# Patient Record
Sex: Female | Born: 1975 | Race: White | Hispanic: No | Marital: Single | Smoking: Never smoker
Health system: Southern US, Community
[De-identification: ages and names within clinical notes are randomized; demographics above are authoritative.]

## PROBLEM LIST (undated history)

## (undated) DIAGNOSIS — M1990 Unspecified osteoarthritis, unspecified site: Secondary | ICD-10-CM

## (undated) DIAGNOSIS — J45909 Unspecified asthma, uncomplicated: Secondary | ICD-10-CM

## (undated) HISTORY — DX: Unspecified osteoarthritis, unspecified site: M19.90

## (undated) HISTORY — DX: Unspecified asthma, uncomplicated: J45.909

---

## 2007-06-06 ENCOUNTER — Other Ambulatory Visit: Admission: RE | Admit: 2007-06-06 | Discharge: 2007-06-06 | Payer: Self-pay | Admitting: Family Medicine

## 2008-10-31 ENCOUNTER — Encounter: Admission: RE | Admit: 2008-10-31 | Discharge: 2008-10-31 | Payer: Self-pay | Admitting: Otolaryngology

## 2009-11-04 HISTORY — PX: KNEE SURGERY: SHX244

## 2010-05-17 ENCOUNTER — Other Ambulatory Visit
Admission: RE | Admit: 2010-05-17 | Discharge: 2010-05-17 | Payer: Self-pay | Source: Home / Self Care | Admitting: Family Medicine

## 2011-09-09 ENCOUNTER — Ambulatory Visit (INDEPENDENT_AMBULATORY_CARE_PROVIDER_SITE_OTHER): Payer: BC Managed Care – PPO | Admitting: Pulmonary Disease

## 2011-09-09 ENCOUNTER — Other Ambulatory Visit (INDEPENDENT_AMBULATORY_CARE_PROVIDER_SITE_OTHER): Payer: BC Managed Care – PPO

## 2011-09-09 ENCOUNTER — Encounter: Payer: Self-pay | Admitting: Pulmonary Disease

## 2011-09-09 VITALS — BP 118/70 | HR 67 | Temp 98.1°F | Ht 69.0 in | Wt 172.2 lb

## 2011-09-09 DIAGNOSIS — R091 Pleurisy: Secondary | ICD-10-CM

## 2011-09-09 LAB — SEDIMENTATION RATE: Sed Rate: 7 mm/hr (ref 0–22)

## 2011-09-09 MED ORDER — PREDNISONE 10 MG PO TABS: ORAL_TABLET | ORAL | Status: DC

## 2011-09-09 MED ORDER — IBUPROFEN 600 MG PO TABS: 600.0000 mg | ORAL_TABLET | Freq: Three times a day (TID) | ORAL | Status: AC

## 2011-09-09 NOTE — Patient Instructions (Signed)
Will check bloodwork for autoimmune disease, and call you with results. Will treat with a course of prednisone, followed by anti-inflammatories to keep pain controlled.  Would start ibuprofen 600mg  roughly every 8 hrs everyday no matter what for one week once prednisone is complete, then can decrease to as needed. Please call me in a few weeks with how things are going.

## 2011-09-09 NOTE — Progress Notes (Signed)
  Subjective:    Patient ID: Brooke Navarro, female    DOB: May 09, 1976, 36 y.o.   MRN: [id number]  HPI The patient is a 36 year old female who I've been asked to see for persistent pleuritic chest pain.  The patient states that she was in her usual state of health until approximately 3 weeks ago, when she began to notice a sharp pain under her left breast with radiation laterally.  She did not have any previous viral prodrome.  She took nonsteroidal anti-inflammatory medications, but continued to have significant pain.  She was seen by her primary care physician where she had a normal chest x-ray, and a d-dimer was normal as well.  She did not have any prior history of long travel time, had no lower extremity edema or discomfort, is not pregnant, and had no history in her family of thromboembolic disease.  The patient was subsequently treated with prednisone for approximately 9 days, and had a significant improvement in her symptoms.  She was subsequently weaned off prednisone, and has been off the medication since 2 days ago.  She noticed a recurrence of her chest discomfort as the prednisone was tapered, and currently is at 3-4/10.  Was 8/10 prior to being on prednisone.  What is interesting is the patient has a history of knee arthritis that is unexplained, and most recently has had some wrist discomfort that hasn't resolved.  She denies any history of spine disease.   Review of Systems  Constitutional: Negative for fever and unexpected weight change.  HENT: Negative for ear pain, nosebleeds, congestion, sore throat, rhinorrhea, sneezing, trouble swallowing, dental problem, postnasal drip and sinus pressure.   Eyes: Negative for redness and itching.  Respiratory: Positive for shortness of breath. Negative for cough, chest tightness and wheezing.   Cardiovascular: Positive for chest pain. Negative for palpitations and leg swelling.  Gastrointestinal: Negative for nausea and vomiting.  Genitourinary:  Negative for dysuria.  Skin: Negative for rash.  Neurological: Negative for headaches.  Hematological: Does not bruise/bleed easily.  Psychiatric/Behavioral: Negative for dysphoric mood. The patient is not nervous/anxious.        Objective:   Physical Exam Constitutional:  Well developed, no acute distress  HENT:  Nares patent without discharge, but narrowed bilat  Oropharynx without exudate, palate and uvula are normal  Eyes:  Perrla, eomi, no scleral icterus  Neck:  No JVD, no TMG  Cardiovascular:  Normal rate, regular rhythm, no rubs or gallops.  No murmurs        Intact distal pulses.  No pericardial rub  Pulmonary :  Normal breath sounds, no stridor or respiratory distress   No rales, rhonchi, or wheezing.  No pleural rub lateral on left  Abdominal:  Soft, nondistended, bowel sounds present.  No tenderness noted.   Musculoskeletal:  No lower extremity edema noted, no calf tenderness  Lymph Nodes:  No cervical lymphadenopathy noted  Skin:  No cyanosis noted  Neurologic:  Alert, appropriate, moves all 4 extremities without obvious deficit.         Assessment & Plan:

## 2011-09-09 NOTE — Assessment & Plan Note (Signed)
The patient is describing classic pleuritic chest pain that started spontaneously, with no bilateral prodrome.  It was very responsive to prednisone, but recurred as the prednisone was being tapered.  She really has no history to suggest thromboembolic disease, and her d-dimer was normal.  Her chest x-ray last month was clear.  It may be this is all post viral in origin, and she just needs a longer course of anti-inflammatory therapy to improve.  However, because of the persistent nature, would also consider whether she may have some type of autoimmune process driving all of this.  She does have significant arthritis in her knee at a very early age without prior injuries, and has other musculoskeletal complaints.  Will check some screening blood work for autoimmune disease, and will also treat her again with a course of prednisone followed by consistent use of nonsteroidal anti-inflammatories.

## 2011-09-10 LAB — RHEUMATOID FACTOR: Rhuematoid fact SerPl-aCnc: <10 IU/mL (ref <=14)

## 2011-09-12 LAB — ANA: Anti Nuclear Antibody(ANA): NEGATIVE

## 2011-09-13 ENCOUNTER — Telehealth: Payer: Self-pay

## 2011-09-13 NOTE — Telephone Encounter (Signed)
Notes Recorded by Barbaraann Share, MD on 09/12/2011 at 5:51 PM All of her autoimmune workup has come back normal. Good news. Lets see how she responds to what we have outlined.   Pt advised of results. Carron Curie, CMA

## 2012-12-12 ENCOUNTER — Other Ambulatory Visit (HOSPITAL_COMMUNITY): Payer: Self-pay | Admitting: Orthopedic Surgery

## 2012-12-12 DIAGNOSIS — M479 Spondylosis, unspecified: Secondary | ICD-10-CM

## 2012-12-20 ENCOUNTER — Ambulatory Visit (HOSPITAL_COMMUNITY)
Admission: RE | Admit: 2012-12-20 | Discharge: 2012-12-20 | Disposition: A | Discharge status: Home / Self Care | Payer: BC Managed Care – PPO | Source: Ambulatory Visit | Attending: Orthopedic Surgery | Admitting: Orthopedic Surgery

## 2012-12-20 ENCOUNTER — Inpatient Hospital Stay
Admission: RE | Admit: 2012-12-20 | Discharge: 2012-12-20 | Disposition: A | Discharge status: Home / Self Care | Payer: Self-pay | Source: Ambulatory Visit | Attending: Orthopedic Surgery | Admitting: Orthopedic Surgery

## 2012-12-20 ENCOUNTER — Other Ambulatory Visit: Payer: Self-pay | Admitting: Orthopedic Surgery

## 2012-12-20 DIAGNOSIS — R200 Anesthesia of skin: Secondary | ICD-10-CM

## 2012-12-20 DIAGNOSIS — M542 Cervicalgia: Secondary | ICD-10-CM | POA: Insufficient documentation

## 2012-12-20 DIAGNOSIS — M79609 Pain in unspecified limb: Secondary | ICD-10-CM | POA: Insufficient documentation

## 2012-12-20 DIAGNOSIS — M439 Deforming dorsopathy, unspecified: Secondary | ICD-10-CM | POA: Insufficient documentation

## 2012-12-20 DIAGNOSIS — R209 Unspecified disturbances of skin sensation: Secondary | ICD-10-CM | POA: Insufficient documentation

## 2012-12-20 DIAGNOSIS — M949 Disorder of cartilage, unspecified: Secondary | ICD-10-CM | POA: Insufficient documentation

## 2012-12-20 DIAGNOSIS — M25519 Pain in unspecified shoulder: Secondary | ICD-10-CM | POA: Insufficient documentation

## 2012-12-20 DIAGNOSIS — M899 Disorder of bone, unspecified: Secondary | ICD-10-CM | POA: Insufficient documentation

## 2012-12-20 DIAGNOSIS — M479 Spondylosis, unspecified: Secondary | ICD-10-CM

## 2013-08-05 ENCOUNTER — Other Ambulatory Visit: Payer: Self-pay | Admitting: Obstetrics and Gynecology

## 2013-08-05 ENCOUNTER — Other Ambulatory Visit (HOSPITAL_COMMUNITY)
Admission: RE | Admit: 2013-08-05 | Discharge: 2013-08-05 | Disposition: A | Discharge status: Home / Self Care | Payer: BC Managed Care – PPO | Source: Ambulatory Visit | Attending: Obstetrics and Gynecology | Admitting: Obstetrics and Gynecology

## 2013-08-05 DIAGNOSIS — Z1151 Encounter for screening for human papillomavirus (HPV): Secondary | ICD-10-CM | POA: Insufficient documentation

## 2013-08-05 DIAGNOSIS — Z01419 Encounter for gynecological examination (general) (routine) without abnormal findings: Secondary | ICD-10-CM | POA: Insufficient documentation

## 2016-07-13 DIAGNOSIS — G5601 Carpal tunnel syndrome, right upper limb: Secondary | ICD-10-CM | POA: Insufficient documentation

## 2016-07-13 DIAGNOSIS — M1811 Unilateral primary osteoarthritis of first carpometacarpal joint, right hand: Secondary | ICD-10-CM | POA: Insufficient documentation

## 2016-08-15 ENCOUNTER — Other Ambulatory Visit: Payer: Self-pay | Admitting: Obstetrics and Gynecology

## 2016-08-15 ENCOUNTER — Other Ambulatory Visit (HOSPITAL_COMMUNITY)
Admission: RE | Admit: 2016-08-15 | Discharge: 2016-08-15 | Disposition: A | Discharge status: Home / Self Care | Payer: PPO | Source: Ambulatory Visit | Attending: Obstetrics and Gynecology | Admitting: Obstetrics and Gynecology

## 2016-08-15 DIAGNOSIS — Z1151 Encounter for screening for human papillomavirus (HPV): Secondary | ICD-10-CM | POA: Diagnosis present

## 2016-08-15 DIAGNOSIS — Z01419 Encounter for gynecological examination (general) (routine) without abnormal findings: Secondary | ICD-10-CM | POA: Insufficient documentation

## 2016-08-17 LAB — CYTOLOGY - PAP
Adequacy: ABSENT
Diagnosis: NEGATIVE
HPV: NOT DETECTED

## 2017-11-03 ENCOUNTER — Ambulatory Visit
Admission: RE | Admit: 2017-11-03 | Discharge: 2017-11-03 | Disposition: A | Discharge status: Home / Self Care | Payer: PPO | Source: Ambulatory Visit | Attending: Obstetrics and Gynecology | Admitting: Obstetrics and Gynecology

## 2017-11-03 ENCOUNTER — Other Ambulatory Visit: Payer: Self-pay | Admitting: Obstetrics and Gynecology

## 2017-11-03 DIAGNOSIS — Z1231 Encounter for screening mammogram for malignant neoplasm of breast: Secondary | ICD-10-CM

## 2018-11-09 ENCOUNTER — Other Ambulatory Visit: Payer: Self-pay | Admitting: Obstetrics and Gynecology

## 2018-11-09 DIAGNOSIS — Z1231 Encounter for screening mammogram for malignant neoplasm of breast: Secondary | ICD-10-CM

## 2019-03-18 ENCOUNTER — Ambulatory Visit
Admission: RE | Admit: 2019-03-18 | Discharge: 2019-03-18 | Disposition: A | Discharge status: Home / Self Care | Payer: BC Managed Care – PPO | Source: Ambulatory Visit | Attending: Obstetrics and Gynecology | Admitting: Obstetrics and Gynecology

## 2019-03-18 ENCOUNTER — Other Ambulatory Visit: Payer: Self-pay

## 2019-03-18 DIAGNOSIS — Z1231 Encounter for screening mammogram for malignant neoplasm of breast: Secondary | ICD-10-CM

## 2019-03-20 ENCOUNTER — Other Ambulatory Visit: Payer: Self-pay | Admitting: Obstetrics and Gynecology

## 2019-03-20 DIAGNOSIS — R928 Other abnormal and inconclusive findings on diagnostic imaging of breast: Secondary | ICD-10-CM

## 2019-03-21 ENCOUNTER — Ambulatory Visit
Admission: RE | Admit: 2019-03-21 | Discharge: 2019-03-21 | Disposition: A | Discharge status: Home / Self Care | Payer: PPO | Source: Ambulatory Visit | Attending: Obstetrics and Gynecology | Admitting: Obstetrics and Gynecology

## 2019-03-21 ENCOUNTER — Ambulatory Visit
Admission: RE | Admit: 2019-03-21 | Discharge: 2019-03-21 | Disposition: A | Discharge status: Home / Self Care | Payer: BC Managed Care – PPO | Source: Ambulatory Visit | Attending: Obstetrics and Gynecology | Admitting: Obstetrics and Gynecology

## 2019-03-21 ENCOUNTER — Other Ambulatory Visit: Payer: Self-pay

## 2019-03-21 DIAGNOSIS — R928 Other abnormal and inconclusive findings on diagnostic imaging of breast: Secondary | ICD-10-CM

## 2019-12-16 ENCOUNTER — Other Ambulatory Visit: Payer: Self-pay | Admitting: Obstetrics and Gynecology

## 2019-12-16 DIAGNOSIS — Z1231 Encounter for screening mammogram for malignant neoplasm of breast: Secondary | ICD-10-CM

## 2020-03-23 ENCOUNTER — Ambulatory Visit: Payer: BC Managed Care – PPO

## 2020-04-23 ENCOUNTER — Ambulatory Visit: Payer: BC Managed Care – PPO

## 2020-04-23 DIAGNOSIS — M79642 Pain in left hand: Secondary | ICD-10-CM | POA: Insufficient documentation

## 2020-06-09 ENCOUNTER — Ambulatory Visit: Payer: BC Managed Care – PPO

## 2020-06-26 DIAGNOSIS — M79644 Pain in right finger(s): Secondary | ICD-10-CM | POA: Insufficient documentation

## 2020-07-07 ENCOUNTER — Other Ambulatory Visit: Payer: Self-pay

## 2020-07-07 ENCOUNTER — Ambulatory Visit
Admission: RE | Admit: 2020-07-07 | Discharge: 2020-07-07 | Disposition: A | Discharge status: Home / Self Care | Payer: BC Managed Care – PPO | Source: Ambulatory Visit | Attending: Obstetrics and Gynecology | Admitting: Obstetrics and Gynecology

## 2020-07-07 DIAGNOSIS — Z1231 Encounter for screening mammogram for malignant neoplasm of breast: Secondary | ICD-10-CM

## 2020-12-23 NOTE — Progress Notes (Signed)
New Patient Note  RE: Brooke Navarro MRN: [id number] DOB: 1976/02/05 Date of Office Visit: 12/24/2020  Consult requested by: No ref. provider found Primary care provider: Tally Joe, MD  Chief Complaint: Allergic Rhinitis  and Cough  History of Present Illness: I had the pleasure of seeing Brooke Navarro for initial evaluation at the [facility] on 12/24/2020. She is a 45 y.o. female, who is referred here by Tally Joe, MD for the evaluation of allergic rhinitis and cough.  She reports symptoms of sneezing, dry coughing, nasal congestion, rhinorrhea, PND, itchy/watery eyes. Symptoms have been going on for many years and worsening the last few years The symptoms are present all year around with worsening in fall/winter. Other triggers include exposure to cats, dogs. Anosmia: no. Headache: not related to allergy. She has used Careers adviser, Xyzal, benadryl, Flonase with some improvement in symptoms. Sinus infections: no. Previous work up includes: none. Previous ENT evaluation: yes for ear issues. Previous sinus imaging: no. History of nasal polyps: no. Last eye exam: 1 year ago. History of reflux: taking famotidine 20mg  twice a day.  She reports symptoms of shortness of breath, coughing with post tussive emesis, not sure about wheezing, nocturnal awakenings for few years. Current medications include albuterol nebulizer. She reports not using aerochamber with inhalers. She tried the following inhalers: albuterol nebulizer. Main triggers are allergies, strong scents, smoking. In the last month, frequency of symptoms: daily when it flares. Frequency of nocturnal symptoms: nightly when it flares. Frequency of SABA use: <1x/week. Interference with physical activity: no. Sleep is disturbed. In the last 12 months, emergency room visits/urgent care visits/doctor office visits or hospitalizations due to respiratory issues: no. In the last 12 months, oral steroids courses: no. Lifetime  history of hospitalization for respiratory issues: no. Prior intubations: no. History of pneumonia: yes in the past. She was evaluated by pulmonologist in the past. Smoking exposure: denies. Up to date with flu vaccine: yes. Up to date with COVID-19 vaccine: yes. Prior Covid-19 infection: no. 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  01/28/2020 CXR: "IMPRESSION:  No evidence of acute cardiopulmonary disease."  Assessment and Plan: Brooke Navarro is a 45 y.o. female with: Other allergic rhinitis Perennial rhinoconjunctivitis symptoms with worsening the fall and winter - coughing is most bothersome.  Tried Allegra, Xyzal, Benadryl and Flonase with some benefit.  No prior work-up. Today's skin testing showed: Positive to grass, ragweed, weed, trees, dust mites, cat, dog, mouse. Borderline to mold. Start environmental control measures as below. Use over the counter antihistamines such as Zyrtec (cetirizine), Claritin (loratadine), Allegra (fexofenadine), or Xyzal (levocetirizine) daily as needed. May take twice a day during allergy flares. May switch antihistamines every few months. Start Singulair (montelukast) 10mg  daily at night. Cautioned that in some children/adults can experience behavioral changes including hyperactivity, agitation, depression, sleep disturbances and suicidal ideations. These side effects are rare, but if you notice them you should notify me and discontinue Singulair (montelukast). Start dymista (fluticasone + azelastine nasal spray combination) 1 spray per nostril twice a day. This replaces Flonase (fluticasone) for now. If it's not covered let 08-22-1985 know.  Nasal saline spray (i.e., Simply Saline) or nasal saline lavage (i.e., NeilMed) is recommended as needed and prior to medicated nasal sprays. Declines eye drops. Consider allergy injections for long term control if above medications do not help the symptoms - handout given.   Allergic conjunctivitis of both  eyes See assessment and plan as above.  Chronic coughing Patient noted  coughing with posttussive emesis at times for the last few years.  Using albuterol nebulizer less than once a week with good benefit.  History of pleurisy.  No prior COVID-19 infection.  Finished treatment for latent TB.  Normal chest x-ray in 2021.  Takes famotidine for reflux. Today's skin testing showed: Positive to grass, ragweed, weed, trees, dust mites, cat, dog, mouse. Borderline to mold. Positive to peanuts and soy and borderline positive to sesame. Today's spirometry was normal with 4% improvement in FEV1 post bronchodilator treatment.  Clinically feeling improved. The most common causes of chronic cough include the following: upper airway cough syndrome (UACS) which is caused by variety of rhinitis conditions; asthma; gastroesophageal reflux disease (GERD); chronic bronchitis from cigarette smoking or other inhaled environmental irritants; non-asthmatic eosinophilic bronchitis; and bronchiectasis.  In prospective studies, these conditions have accounted for up to 94% of the causes of chronic cough in immunocompetent adults.  Given patient's clinical history and testing results concerning if environmental allergies are contributing to her coughing.  Patient eats peanuts frequently and did not notice any immediate symptoms. Rarely has sesame or soy.  The positive skin testing may be due to oral allergy syndrome as she had very large sensitivity to birch pollen which cross reacts with peanuts and soy. Monitor symptoms after eating soy and peanuts. May want to decrease consumption and see if it improves symptoms. Okay to eat sesame as before. For mild symptoms you can take over the counter antihistamines such as Benadryl and monitor symptoms closely. If symptoms worsen or if you have severe symptoms including breathing issues, throat closure, significant swelling, whole body hives, severe diarrhea and vomiting, lightheadedness  then seek immediate medical care. May use albuterol rescue inhaler 2 puffs or nebulizer every 4 to 6 hours as needed for shortness of breath, chest tightness, coughing, and wheezing. May use albuterol rescue inhaler 2 puffs 5 to 15 minutes prior to strenuous physical activities. Monitor frequency of use.   Heartburn Stable with famotidine 20mg  BID. See below for lifestyle and dietary modifications. Continue famotine 20mg  twice a day. If coughing is not improved after 1 month then start omeprazole 40mg  once a day in the morning. Nothing to eat or drink for 30 minutes afterwards.   Return in about 2 months (around 02/24/2021).  Meds ordered this encounter  Medications   Azelastine-Fluticasone 137-50 MCG/ACT SUSP    Sig: Place 1 spray into the nose in the morning and at bedtime.    Dispense:  23 g    Refill:  5    Failed flonase   montelukast (SINGULAIR) 10 MG tablet    Sig: Take 1 tablet (10 mg total) by mouth at bedtime.    Dispense:  30 tablet    Refill:  5   albuterol (VENTOLIN HFA) 108 (90 Base) MCG/ACT inhaler    Sig: Inhale 2 puffs into the lungs every 4 (four) hours as needed for wheezing or shortness of breath (coughing fits).    Dispense:  18 g    Refill:  1   omeprazole (PRILOSEC) 40 MG capsule    Sig: Take 1 capsule (40 mg total) by mouth daily. Nothing to eat or drink for 30 minutes afterwards.    Dispense:  30 capsule    Refill:  3    Hold Rx until ready for pick up.    Lab Orders  No laboratory test(s) ordered today    Other allergy screening: Food allergy: no Medication allergy: no Hymenoptera allergy: no Urticaria: no  Eczema:no History of recurrent infections suggestive of immunodeficency: no  Diagnostics: Spirometry:  Tracings reviewed. Her effort: Good reproducible efforts. FVC: 3.71L FEV1: 3.06L, 89% predicted FEV1/FVC ratio: 82% Interpretation: Spirometry consistent with normal pattern with 4% improvement in FEV1 post bronchodilator treatment.  Clinically feeling improved.  Please see scanned spirometry results for details.  Skin Testing: Environmental allergy panel and select foods. Positive to grass, ragweed, weed, trees, dust mites, cat, dog, mouse. Borderline to mold. Positive to peanuts and soy and borderline positive to sesame. Results discussed with patient/family.  Airborne Adult Perc - 12/24/20 0930     Time Antigen Placed 0930    Allergen Manufacturer Waynette Buttery    Location Back    Number of Test 59    Panel 1 Select    1. Control-Buffer 50% Glycerol Negative    2. Control-Histamine 1 mg/ml 2+    3. Albumin saline Negative    4. Bahia 4+    5. [place] 4+    6. Johnson 4+    7. Kentucky Blue 4+    8. Meadow Fescue 4+    9. Perennial Rye 4+    10. Sweet Vernal 4+    11. Timothy 4+    12. Cocklebur Negative    13. Burweed Marshelder Negative    14. Ragweed, short 4+    15. Ragweed, Giant 2+    16. Plantain,  English 2+    17. Lamb's Quarters Negative    18. Sheep Sorrell Negative    19. Rough Pigweed Negative    20. Marsh Elder, Rough Negative    21. Mugwort, Common Negative    22. Ash mix 2+    23. Birch mix 4+    24. Beech American 4+    25. Box, Elder 3+    26. Cedar, red Negative    27. Cottonwood, Guinea-Bissau Negative    28. Elm mix Negative    29. Hickory Negative    30. Maple mix 2+    31. Oak, Guinea[place] mix 3+    32. Pecan Pollen Negative    33. Pine mix Negative    34. Sycamore Eastern Negative    35. Walnut, Black Pollen Negative    36. Alternaria alternata Negative    37. Cladosporium Herbarum Negative    38. Aspergillus mix Negative    39. Penicillium mix Negative    40. Bipolaris sorokiniana (Helminthosporium) Negative    41. Drechslera spicifera (Curvularia) Negative    42. Mucor plumbeus Negative    43. Fusarium moniliforme Negative    44. Aureobasidium pullulans (pullulara) Negative    45. Rhizopus oryzae Negative    46. Botrytis cinera Negative    47. Epicoccum nigrum Negative    48.  Phoma betae Negative    49. Candida Albicans Negative    50. Trichophyton mentagrophytes Negative    51. Mite, D Farinae  5,000 AU/ml 2+    52. Mite, D Pteronyssinus  5,000 AU/ml 2+    53. Cat Hair 10,000 BAU/ml 3+    54.  Dog Epithelia 4+    55. Mixed Feathers Negative    56. Horse Epithelia Negative    57. Cockroach, German Negative    58. Mouse 3+    59. Tobacco Leaf Negative             Food Perc - 12/24/20 0931       Test Information   Time Antigen Placed 7902    Allergen Manufacturer Waynette Buttery    Location Back  Number of allergen test 10    Food Select      Food   1. Peanut --   10x6   2. Soybean food --   9x5   3. Wheat, whole Negative    4. Sesame --   2x2   5. Milk, cow Negative    6. Egg White, chicken Negative    7. Casein Negative    8. Shellfish mix Negative    9. Fish mix Negative    10. Cashew Negative             Intradermal - 12/24/20 1021     Time Antigen Placed 1021    Allergen Manufacturer Greer    Location Arm    Number of Test 6    Intradermal Select    Control Negative    Mold 1 Negative    Mold 2 Negative    Mold 3 Negative    Mold 4 --   +/-   Cockroach Negative             Past Medical History: Patient Active Problem List   Diagnosis Date Noted   Other allergic rhinitis 12/24/2020   Allergic conjunctivitis of both eyes 12/24/2020   Heartburn 12/24/2020   Chronic coughing 12/24/2020   Pleurisy, residual 09/09/2011   Past Medical History:  Diagnosis Date   Allergic rhinitis    Arthritis    Past Surgical History: Past Surgical History:  Procedure Laterality Date   KNEE SURGERY  11/2009   left   Medication List:  Current Outpatient Medications  Medication Sig Dispense Refill   albuterol (VENTOLIN HFA) 108 (90 Base) MCG/ACT inhaler Inhale 2 puffs into the lungs every 4 (four) hours as needed for wheezing or shortness of breath (coughing fits). 18 g 1   Azelastine-Fluticasone 137-50 MCG/ACT SUSP Place 1 spray  into the nose in the morning and at bedtime. 23 g 5   clindamycin (CLINDAGEL) 1 % gel Apply 1 application topically 2 (two) times daily.     clobetasol ointment (TEMOVATE) 0.05 % Apply topically 2 (two) times daily.     cyclobenzaprine (FLEXERIL) 10 MG tablet 1 TABLET THREE TIMES A DAY AS NEEDED ORALLY 30 DAY(S)     diphenhydrAMINE (BENADRYL) 25 MG tablet      escitalopram (LEXAPRO) 10 MG tablet Take 10 mg by mouth daily.     famotidine (PEPCID) 20 MG tablet      JUNEL FE 1/20 1-20 MG-MCG tablet Take by mouth.     levocetirizine (XYZAL) 5 MG tablet      meloxicam (MOBIC) 15 MG tablet Take 15 mg by mouth daily.     Misc Natural Products (COSAMIN ASU FOR JOINT HEALTH) CAPS      montelukast (SINGULAIR) 10 MG tablet Take 1 tablet (10 mg total) by mouth at bedtime. 30 tablet 5   Multiple Vitamin (MULTI-VITAMIN) tablet Take 1 tablet by mouth daily.     omeprazole (PRILOSEC) 40 MG capsule Take 1 capsule (40 mg total) by mouth daily. Nothing to eat or drink for 30 minutes afterwards. 30 capsule 3   azelastine (ASTELIN) 0.1 % nasal spray Place 1-2 sprays into both nostrils 2 (two) times daily as needed (nasal drainage). Use in each nostril as directed 30 mL 5   fluticasone (FLONASE) 50 MCG/ACT nasal spray Place 1 spray into both nostrils 2 (two) times daily as needed (nasal congestion). 16 g 5   No current facility-administered medications for this visit.   Allergies: No Known  Allergies Social History: Social History   Socioeconomic History   Marital status: Single    Spouse name: Not on file   Number of children: 0   Years of education: Not on file   Highest education level: Not on file  Occupational History   Occupation: Theatre manager II    Employer: [employer]    Comment: [employer]  Tobacco Use   Smoking status: Never    Passive exposure: Current   Smokeless tobacco: Never  Vaping Use   Vaping Use: Never used  Substance and Sexual Activity   Alcohol  use: Yes   Drug use: Never   Sexual activity: Not on file  Other Topics Concern   Not on file  Social History Narrative   Pt is single and lives with family.    Social Determinants of Health   Financial Resource Strain: Not on file  Food Insecurity: Not on file  Transportation Needs: Not on file  Physical Activity: Not on file  Stress: Not on file  Social Connections: Not on file   Lives in a house which is 43 years old. Smoking: denies Occupation: PA  Environmental HistorySurveyor, minerals in the house: no Engineer, civil (consulting) in the family room: no Carpet in the bedroom: no Heating: electric Cooling: central Pet: yes 2 dogs sometimes come visit  Family History: Family History  Problem Relation Age of Onset   Allergic rhinitis Mother    Allergic rhinitis Father    Urticaria Father    Allergic rhinitis Sister    Urticaria Sister    Asthma Sister    Allergic rhinitis Brother    Breast cancer Maternal Grandmother    Melanoma Maternal Grandfather    Heart disease Paternal Grandmother    Lung cancer Paternal Grandfather    Review of Systems  Constitutional:  Negative for appetite change, chills, fever and unexpected weight change.  HENT:  Positive for congestion, postnasal drip, rhinorrhea and sneezing.   Eyes:  Negative for itching.  Respiratory:  Positive for cough. Negative for chest tightness, shortness of breath and wheezing.   Cardiovascular:  Negative for chest pain.  Gastrointestinal:  Negative for abdominal pain.  Genitourinary:  Negative for difficulty urinating.  Skin:  Negative for rash.  Allergic/Immunologic: Positive for environmental allergies.  Neurological:  Negative for headaches.   Objective: BP 130/78   Pulse 95   Temp (!) 97 F (36.1 C) (Temporal)   Resp 17   Ht 5' 8.5" (1.74 m)   Wt 220 lb 12 oz (100.1 kg)   SpO2 97%   BMI 33.08 kg/m  Body mass index is 33.08 kg/m. Physical Exam Vitals and nursing note reviewed.  Constitutional:       Appearance: Normal appearance. She is well-developed.  HENT:     Head: Normocephalic and atraumatic.     Right Ear: External ear normal.     Left Ear: External ear normal.     Nose: Nose normal.     Mouth/Throat:     Mouth: Mucous membranes are moist.     Pharynx: Oropharynx is clear.  Eyes:     Conjunctiva/sclera: Conjunctivae normal.  Cardiovascular:     Rate and Rhythm: Normal rate and regular rhythm.     Heart sounds: Normal heart sounds. No murmur heard.   No friction rub. No gallop.  Pulmonary:     Effort: Pulmonary effort is normal.     Breath sounds: Normal breath sounds. No wheezing, rhonchi or rales.  Abdominal:     Palpations: Abdomen is soft.  Musculoskeletal:     Cervical back: Neck supple.  Skin:    General: Skin is warm.     Findings: No rash.  Neurological:     Mental Status: She is alert and oriented to person, place, and time.  Psychiatric:        Behavior: Behavior normal.  The plan was reviewed with the patient/family, and all questions/concerned were addressed.  It was my pleasure to see Maha today and participate in her care. Please feel free to contact me with any questions or concerns.  Sincerely,  Wyline Mood, DO Allergy & Immunology  [facility] office: [phone] [facility] office: [phone]

## 2020-12-24 ENCOUNTER — Other Ambulatory Visit: Payer: Self-pay

## 2020-12-24 ENCOUNTER — Telehealth: Payer: Self-pay

## 2020-12-24 ENCOUNTER — Encounter: Payer: Self-pay | Admitting: Allergy

## 2020-12-24 ENCOUNTER — Ambulatory Visit: Payer: 59 | Admitting: Allergy

## 2020-12-24 VITALS — BP 130/78 | HR 95 | Temp 97.0°F | Resp 17 | Ht 68.5 in | Wt 220.8 lb

## 2020-12-24 DIAGNOSIS — R053 Chronic cough: Secondary | ICD-10-CM | POA: Diagnosis not present

## 2020-12-24 DIAGNOSIS — R12 Heartburn: Secondary | ICD-10-CM | POA: Diagnosis not present

## 2020-12-24 DIAGNOSIS — J3089 Other allergic rhinitis: Secondary | ICD-10-CM

## 2020-12-24 DIAGNOSIS — H1013 Acute atopic conjunctivitis, bilateral: Secondary | ICD-10-CM | POA: Diagnosis not present

## 2020-12-24 MED ORDER — AZELASTINE-FLUTICASONE 137-50 MCG/ACT NA SUSP: 1.0000 | Freq: Two times a day (BID) | NASAL | 5 refills | Status: DC

## 2020-12-24 MED ORDER — OMEPRAZOLE 40 MG PO CPDR: 40.0000 mg | DELAYED_RELEASE_CAPSULE | Freq: Every day | ORAL | 3 refills | Status: DC

## 2020-12-24 MED ORDER — MONTELUKAST SODIUM 10 MG PO TABS: 10.0000 mg | ORAL_TABLET | Freq: Every day | ORAL | 5 refills | Status: DC

## 2020-12-24 MED ORDER — AZELASTINE HCL 0.1 % NA SOLN: 1.0000 | Freq: Two times a day (BID) | NASAL | 5 refills | Status: DC | PRN

## 2020-12-24 MED ORDER — ALBUTEROL SULFATE HFA 108 (90 BASE) MCG/ACT IN AERS: 2.0000 | INHALATION_SPRAY | RESPIRATORY_TRACT | 1 refills | Status: DC | PRN

## 2020-12-24 MED ORDER — FLUTICASONE PROPIONATE 50 MCG/ACT NA SUSP: 1.0000 | Freq: Two times a day (BID) | NASAL | 5 refills | Status: DC | PRN

## 2020-12-24 NOTE — Telephone Encounter (Signed)
Use Flonase (fluticasone) nasal spray 1 spray per nostril twice a day as needed for nasal congestion.   Use azelastine nasal spray 1-2 sprays per nostril twice a day as needed for runny nose/drainage.

## 2020-12-24 NOTE — Assessment & Plan Note (Signed)
Stable with famotidine 20mg  BID.  See below for lifestyle and dietary modifications.  Continue famotine 20mg  twice a day.  If coughing is not improved after 1 month then start omeprazole 40mg  once a day in the morning. Nothing to eat or drink for 30 minutes afterwards.

## 2020-12-24 NOTE — Assessment & Plan Note (Addendum)
Perennial rhinoconjunctivitis symptoms with worsening the fall and winter - coughing is most bothersome.  Tried Allegra, Xyzal, Benadryl and Flonase with some benefit.  No prior work-up.  Today's skin testing showed: Positive to grass, ragweed, weed, trees, dust mites, cat, dog, mouse. Borderline to mold.  Start environmental control measures as below.  Use over the counter antihistamines such as Zyrtec (cetirizine), Claritin (loratadine), Allegra (fexofenadine), or Xyzal (levocetirizine) daily as needed. May take twice a day during allergy flares. May switch antihistamines every few months.  Start Singulair (montelukast) 10mg  daily at night.  Cautioned that in some children/adults can experience behavioral changes including hyperactivity, agitation, depression, sleep disturbances and suicidal ideations. These side effects are rare, but if you notice them you should notify me and discontinue Singulair (montelukast).  Start dymista (fluticasone + azelastine nasal spray combination) 1 spray per nostril twice a day. This replaces Flonase (fluticasone) for now. If it's not covered let know.   Nasal saline spray (i.e., Simply Saline) or nasal saline lavage (i.e., NeilMed) is recommended as needed and prior to medicated nasal sprays.  Declines eye drops.  Consider allergy injections for long term control if above medications do not help the symptoms - handout given.

## 2020-12-24 NOTE — Assessment & Plan Note (Signed)
Patient noted coughing with posttussive emesis at times for the last few years.  Using albuterol nebulizer less than once a week with good benefit.  History of pleurisy.  No prior COVID-19 infection.  Finished treatment for latent TB.  Normal chest x-ray in 2021.  Takes famotidine for reflux.  Today's skin testing showed: Positive to grass, ragweed, weed, trees, dust mites, cat, dog, mouse. Borderline to mold. Positive to peanuts and soy and borderline positive to sesame.  Today's spirometry was normal with 4% improvement in FEV1 post bronchodilator treatment.  Clinically feeling improved. . The most common causes of chronic cough include the following: upper airway cough syndrome (UACS) which is caused by variety of rhinitis conditions; asthma; gastroesophageal reflux disease (GERD); chronic bronchitis from cigarette smoking or other inhaled environmental irritants; non-asthmatic eosinophilic bronchitis; and bronchiectasis.  . In prospective studies, these conditions have accounted for up to 94% of the causes of chronic cough in immunocompetent adults.  . Given patient's clinical history and testing results concerning if environmental allergies are contributing to her coughing.  . Patient eats peanuts frequently and did not notice any immediate symptoms. Rarely has sesame or soy.  o The positive skin testing may be due to oral allergy syndrome as she had very large sensitivity to birch pollen which cross reacts with peanuts and soy. . Monitor symptoms after eating soy and peanuts. May want to decrease consumption and see if it improves symptoms. [place] to eat sesame as before. . For mild symptoms you can take over the counter antihistamines such as Benadryl and monitor symptoms closely. If symptoms worsen or if you have severe symptoms including breathing issues, throat closure, significant swelling, whole body hives, severe diarrhea and vomiting, lightheadedness then seek immediate medical care. . May  use albuterol rescue inhaler 2 puffs or nebulizer every 4 to 6 hours as needed for shortness of breath, chest tightness, coughing, and wheezing. May use albuterol rescue inhaler 2 puffs 5 to 15 minutes prior to strenuous physical activities. Monitor frequency of use.

## 2020-12-24 NOTE — Addendum Note (Signed)
Addended by: Ellamae Sia on: 12/24/2020 11:35 AM   Modules accepted: Orders

## 2020-12-24 NOTE — Assessment & Plan Note (Signed)
.   See assessment and plan as above. 

## 2020-12-24 NOTE — Telephone Encounter (Signed)
Called and informed patient of the changes for the nasal sprays.

## 2020-12-24 NOTE — Telephone Encounter (Signed)
Patient called and stated the Dymista isn't covered by her insurance.

## 2020-12-24 NOTE — Patient Instructions (Addendum)
Today's skin testing showed: Positive to grass, ragweed, weed, trees, dust mites, cat, dog, mouse. Borderline to mold.  Positive to peanuts and soy and borderline positive to sesame. Results given.  Environmental allergies Start environmental control measures as below. Use over the counter antihistamines such as Zyrtec (cetirizine), Claritin (loratadine), Allegra (fexofenadine), or Xyzal (levocetirizine) daily as needed. May take twice a day during allergy flares. May switch antihistamines every few months. Start Singulair (montelukast) 10mg  daily at night. Cautioned that in some children/adults can experience behavioral changes including hyperactivity, agitation, depression, sleep disturbances and suicidal ideations. These side effects are rare, but if you notice them you should notify me and discontinue Singulair (montelukast). Start dymista (fluticasone + azelastine nasal spray combination) 1 spray per nostril twice a day. This replaces Flonase (fluticasone) for now. If it's not covered let know.  Nasal saline spray (i.e., Simply Saline) or nasal saline lavage (i.e., NeilMed) is recommended as needed and prior to medicated nasal sprays. Consider allergy injections for long term control if above medications do not help the symptoms - handout given.   Coughing: The most common causes of chronic cough include the following: upper airway cough syndrome (UACS) which is caused by variety of rhinitis conditions; asthma; gastroesophageal reflux disease (GERD); chronic bronchitis from cigarette smoking or other inhaled environmental irritants; non-asthmatic eosinophilic bronchitis; and bronchiectasis.  In prospective studies, these conditions have accounted for up to 94% of the causes of chronic cough in immunocompetent adults.   May use albuterol rescue inhaler 2 puffs or nebulizer every 4 to 6 hours as needed for shortness of breath, chest tightness, coughing, and wheezing. May use albuterol rescue  inhaler 2 puffs 5 to 15 minutes prior to strenuous physical activities. Monitor frequency of use.   Food: Monitor symptoms after eating soy and peanuts. May want to decrease consumption and see if it improves symptoms. Okay to eat sesame as before. For mild symptoms you can take over the counter antihistamines such as Benadryl and monitor symptoms closely. If symptoms worsen or if you have severe symptoms including breathing issues, throat closure, significant swelling, whole body hives, severe diarrhea and vomiting, lightheadedness then seek immediate medical care.  Heartburn: See below for lifestyle and dietary modifications. Continue famotine 20mg  twice a day. If coughing is not improved after 1 month then start omeprazole 40mg  once a day in the morning. Nothing to eat or drink for 30 minutes afterwards.   Follow up in 2 months or sooner if needed.   Reducing Pollen Exposure Pollen seasons: trees (spring), grass (summer) and ragweed/weeds (fall). Keep windows closed in your home and car to lower pollen exposure.  Install air conditioning in the bedroom and throughout the house if possible.  Avoid going out in dry windy days - especially early morning. Pollen counts are highest between 5 - 10 AM and on dry, hot and windy days.  Save outside activities for late afternoon or after a heavy rain, when pollen levels are lower.  Avoid mowing of grass if you have grass pollen allergy. Be aware that pollen can also be transported indoors on people and pets.  Dry your clothes in an automatic dryer rather than hanging them outside where they might collect pollen.  Rinse hair and eyes before bedtime. Control of House Dust Mite Allergen Dust mite allergens are a common trigger of allergy and asthma symptoms. While they can be found throughout the house, these microscopic creatures thrive in warm, humid environments such as bedding, upholstered furniture and carpeting.  Because so much time is spent  in the bedroom, it is essential to reduce mite levels there.  Encase pillows, mattresses, and box springs in special allergen-proof fabric covers or airtight, zippered plastic covers.  Bedding should be washed weekly in hot water (130 F) and dried in a hot dryer. Allergen-proof covers are available for comforters and pillows that can't be regularly washed.  Wash the allergy-proof covers every few months. Minimize clutter in the bedroom. Keep pets out of the bedroom.  Keep humidity less than 50% by using a dehumidifier or air conditioning. You can buy a humidity measuring device called a hygrometer to monitor this.  If possible, replace carpets with hardwood, linoleum, or washable area rugs. If that's not possible, vacuum frequently with a vacuum that has a HEPA filter. Remove all upholstered furniture and non-washable window drapes from the bedroom. Remove all non-washable stuffed toys from the bedroom.  Wash stuffed toys weekly. Pet Allergen Avoidance: Contrary to popular opinion, there are no "hypoallergenic" breeds of dogs or cats. That is because people are not allergic to an animal's hair, but to an allergen found in the animal's saliva, dander (dead skin flakes) or urine. Pet allergy symptoms typically occur within minutes. For some people, symptoms can build up and become most severe 8 to 12 hours after contact with the animal. People with severe allergies can experience reactions in public places if dander has been transported on the pet owners' clothing. Keeping an animal outdoors is only a partial solution, since homes with pets in the yard still have higher concentrations of animal allergens. Before getting a pet, ask your allergist to determine if you are allergic to animals. If your pet is already considered part of your family, try to minimize contact and keep the pet out of the bedroom and other rooms where you spend a great deal of time. As with dust mites, vacuum carpets often or  replace carpet with a hardwood floor, tile or linoleum. High-efficiency particulate air (HEPA) cleaners can reduce allergen levels over time. While dander and saliva are the source of cat and dog allergens, urine is the source of allergens from rabbits, hamsters, mice and [place] pigs; so ask a non-allergic family member to clean the animal's cage. If you have a pet allergy, talk to your allergist about the potential for allergy immunotherapy (allergy shots). This strategy can often provide long-term relief. Mold Control Mold and fungi can grow on a variety of surfaces provided certain temperature and moisture conditions exist.  Outdoor molds grow on plants, decaying vegetation and soil. The major outdoor mold, Alternaria and Cladosporium, are found in very high numbers during hot and dry conditions. Generally, a late summer - fall peak is seen for common outdoor fungal spores. Rain will temporarily lower outdoor mold spore count, but counts rise rapidly when the rainy period ends. The most important indoor molds are Aspergillus and Penicillium. Dark, humid and poorly ventilated basements are ideal sites for mold growth. The next most common sites of mold growth are the bathroom and the kitchen. Outdoor (Seasonal) Mold Control Use air conditioning and keep windows closed. Avoid exposure to decaying vegetation. Avoid leaf raking. Avoid grain handling. Consider wearing a face mask if working in moldy areas.  Indoor (Perennial) Mold Control  Maintain humidity below 50%. Get rid of mold growth on hard surfaces with water, detergent and, if necessary, 5% bleach (do not mix with other cleaners). Then dry the area completely. If mold covers an area more than 10  square feet, consider hiring an Gaffer. For clothing, washing with soap and water is best. If moldy items cannot be cleaned and dried, throw them away. Remove sources e.g. contaminated carpets. Repair and seal leaking  roofs or pipes. Using dehumidifiers in damp basements may be helpful, but empty the water and clean units regularly to prevent mildew from forming. All rooms, especially basements, bathrooms and kitchens, require ventilation and cleaning to deter mold and mildew growth. Avoid carpeting on concrete or damp floors, and storing items in damp areas.

## 2021-01-26 ENCOUNTER — Telehealth: Payer: Self-pay

## 2021-01-26 MED ORDER — EPINEPHRINE 0.3 MG/0.3ML IJ SOAJ: 0.3000 mg | Freq: Once | INTRAMUSCULAR | 2 refills | Status: AC

## 2021-01-26 NOTE — Telephone Encounter (Signed)
Patient called to schedule appointment to start allergy injections in the [place] office.  Prescribing Auvi-Q.

## 2021-02-02 ENCOUNTER — Other Ambulatory Visit: Payer: Self-pay | Admitting: Allergy

## 2021-02-02 DIAGNOSIS — J3089 Other allergic rhinitis: Secondary | ICD-10-CM

## 2021-02-03 DIAGNOSIS — J302 Other seasonal allergic rhinitis: Secondary | ICD-10-CM

## 2021-02-03 NOTE — Progress Notes (Signed)
Aeroallergen Immunotherapy   Ordering Provider: Dr. Wyline Mood   Patient Details  Name: Brooke Navarro  MRN: [id number]  Date of Birth: 12/05/1975   Order 2 of 2   Vial Label: Dm-C-D   0.5 ml (Volume)  1:10 Concentration -- Cat Hair  0.5 ml (Volume)  1:10 Concentration -- Dog Epithelia  0.5 ml (Volume)   AU Concentration -- Mite Mix (DF 5,000 & DP 5,000)    1.5  ml Extract Subtotal  3.5  ml Diluent  5.0  ml Maintenance Total   Schedule:  B  Silver Vial (1:1,000,000): Schedule B (6 doses)  Blue Vial (1:100,000): Schedule B (6 doses)  Yellow Vial (1:10,000): Schedule B (6 doses)  Green Vial (1:1,000): Schedule B (6 doses)  Red Vial (1:100): Schedule A (14 doses)   Special Instructions: once per week

## 2021-02-03 NOTE — Progress Notes (Signed)
Aeroallergen Immunotherapy   Ordering Provider: Dr. Wyline Mood   Patient Details  Name: Brooke Navarro  MRN: [id number]  Date of Birth: [birth year]   Order 1 of 2   Vial Label: G-W-RW-T   0.3 ml (Volume)  BAU Concentration -- 7 Grass Mix* 100,000 ([address] [place], [place], [place], [place], [place], [place], [place])  0.2 ml (Volume)  1:20 Concentration -- [place]  0.3 ml (Volume)  BAU Concentration -- [place] 10,000  0.2 ml (Volume)  1:20 Concentration -- [place]  0.3 ml (Volume)  1:20 Concentration -- Ragweed Mix  0.5 ml (Volume)  1:20 Concentration -- Weed Mix*  0.5 ml (Volume)  1:20 Concentration -- Eastern 10 Tree Mix (also Sweet Gum)  0.2 ml (Volume)  1:20 Concentration -- Box [place]    2.5  ml Extract Subtotal  2.5  ml Diluent  5.0  ml Maintenance Total   Schedule:  B  Silver Vial (1:1,000,000): Schedule B (6 doses)  Blue Vial (1:100,000): Schedule B (6 doses)  Yellow Vial (1:10,000): Schedule B (6 doses)  Green Vial (1:1,000): Schedule B (6 doses)  Red Vial (1:100): Schedule A (14 doses)   Special Instructions: once per week

## 2021-02-03 NOTE — Progress Notes (Signed)
VIALS MADE. EXP 02-03-22 

## 2021-02-16 ENCOUNTER — Ambulatory Visit (INDEPENDENT_AMBULATORY_CARE_PROVIDER_SITE_OTHER): Payer: 59 | Admitting: *Deleted

## 2021-02-16 ENCOUNTER — Ambulatory Visit: Payer: 59

## 2021-02-16 ENCOUNTER — Other Ambulatory Visit: Payer: Self-pay

## 2021-02-16 DIAGNOSIS — J309 Allergic rhinitis, unspecified: Secondary | ICD-10-CM | POA: Diagnosis not present

## 2021-02-16 NOTE — Progress Notes (Signed)
Immunotherapy   Patient Details  Name: Brooke Navarro MRN: [id number] Date of Birth: 09/24/1975  02/16/2021  Brooke Navarro started injections for  G-W-RW-T, DM-C-D Following schedule: B  Frequency:1 time per week Epi-Pen:Epi-Pen Available  Consent signed and patient instructions given.  Patient started allergy injections today and received 0.49mL G-W-RW-T in the RUA and 0.70mL of DM-C-D in the LUA. Patient waited 30 minutes in office and did not experience any issues.    Brooke Navarro 02/16/2021, 11:31 AM

## 2021-02-18 DIAGNOSIS — J3089 Other allergic rhinitis: Secondary | ICD-10-CM

## 2021-02-23 ENCOUNTER — Ambulatory Visit (INDEPENDENT_AMBULATORY_CARE_PROVIDER_SITE_OTHER): Payer: 59 | Admitting: *Deleted

## 2021-02-23 DIAGNOSIS — J309 Allergic rhinitis, unspecified: Secondary | ICD-10-CM

## 2021-03-02 ENCOUNTER — Ambulatory Visit (INDEPENDENT_AMBULATORY_CARE_PROVIDER_SITE_OTHER): Payer: 59 | Admitting: *Deleted

## 2021-03-02 DIAGNOSIS — J309 Allergic rhinitis, unspecified: Secondary | ICD-10-CM

## 2021-03-04 ENCOUNTER — Ambulatory Visit: Payer: 59 | Admitting: Allergy

## 2021-03-04 ENCOUNTER — Other Ambulatory Visit: Payer: Self-pay

## 2021-03-04 ENCOUNTER — Encounter: Payer: Self-pay | Admitting: Allergy

## 2021-03-04 VITALS — BP 114/78 | HR 82 | Temp 97.2°F | Resp 16

## 2021-03-04 DIAGNOSIS — R053 Chronic cough: Secondary | ICD-10-CM | POA: Diagnosis not present

## 2021-03-04 DIAGNOSIS — J3089 Other allergic rhinitis: Secondary | ICD-10-CM

## 2021-03-04 DIAGNOSIS — H1013 Acute atopic conjunctivitis, bilateral: Secondary | ICD-10-CM

## 2021-03-04 DIAGNOSIS — J302 Other seasonal allergic rhinitis: Secondary | ICD-10-CM | POA: Diagnosis not present

## 2021-03-04 DIAGNOSIS — R12 Heartburn: Secondary | ICD-10-CM

## 2021-03-04 DIAGNOSIS — H1010 Acute atopic conjunctivitis, unspecified eye: Secondary | ICD-10-CM | POA: Insufficient documentation

## 2021-03-04 MED ORDER — MONTELUKAST SODIUM 10 MG PO TABS: 10.0000 mg | ORAL_TABLET | Freq: Every day | ORAL | 5 refills | Status: DC

## 2021-03-04 NOTE — Assessment & Plan Note (Addendum)
Past history - Patient noted coughing with posttussive emesis at times for the last few years.  Using albuterol nebulizer less than once a week with good benefit.  History of pleurisy.  No prior COVID-19 infection.  Finished treatment for latent TB.  Normal chest x-ray in 2021.  Takes famotidine for reflux. 2022 spirometry was normal with 4% improvement in FEV1 post bronchodilator treatment.  Clinically feeling improved. Interim history - coughing a little now with the fall weather change. Did not try albuterol. Not wore after eating peanuts, soy or sesame. . Starting in March 1st o Start Airduo 1 puff once a day and rinse mouth after each use. May take 1 puff twice a day if needed. o Sample given and demonstrated proper use.  . May use albuterol rescue inhaler 2 puffs or nebulizer every 4 to 6 hours as needed for shortness of breath, chest tightness, coughing, and wheezing. May use albuterol rescue inhaler 2 puffs 5 to 15 minutes prior to strenuous physical activities. Monitor frequency of use.  . Get spirometry at next visit.

## 2021-03-04 NOTE — Progress Notes (Signed)
Follow Up Note  RE: Brooke Navarro MRN: [id number] DOB: 1976/01/04 Date of Office Visit: 03/04/2021  Referring provider: Tally Joe, MD Primary care provider: Tally Joe, MD  Chief Complaint: Follow-up  History of Present Illness: I had the pleasure of seeing Brooke Navarro for a follow up visit at the [facility] on 03/04/2021. She is a 45 y.o. female, who is being followed for allergic rhinoconjunctivitis on AIT, chronic coughing and heartburn. Her previous allergy office visit was on 12/24/2020 with Dr. Selena Batten. Today is a regular follow up visit.  Allergic rhino conjunctivitis Started AIT in September with no issues. Patient noted some increased symptoms and coughing with the fall weather change.  Taking Singulair 10mg  daily and Xyzal daily with good benefit.  Dymista not covered, using Flonase and azelastine daily with good benefit. No nosebleeds.      Chronic coughing The coughing is back but not is bad as in the spring. It is worse when she is outdoors a lot that day.  Did not use albuterol for these situations.  Heartburn Takes famotidine 20mg  twice a day   Tolerates peanuts, soy and sesame with no issues.   Assessment and Plan: Brooke Navarro is a 45 y.o. female with: Seasonal and perennial allergic rhinoconjunctivitis Past history - Perennial rhinoconjunctivitis symptoms with worsening the fall and winter - coughing is most bothersome.  2022 skin testing showed: Positive to grass, ragweed, weed, trees, dust mites, cat, dog, mouse. Borderline to mold. Interim history - started AIT on 02/16/2021 (G-W-RW-T, DM-C-D) Continue environmental control measures as below. Use over the counter antihistamines such as Zyrtec (cetirizine), Claritin (loratadine), Allegra (fexofenadine), or Xyzal (levocetirizine) daily as needed. May take twice a day during allergy flares. May switch antihistamines every few months. Continue Singulair (montelukast) 10mg  daily at night. Use  Flonase (fluticasone) nasal spray 1 spray per nostril twice a day as needed for nasal congestion.  Use azelastine nasal spray 1-2 sprays per nostril twice a day as needed for runny nose/drainage. Continue allergy injections. Nasal saline spray (i.e., Simply Saline) or nasal saline lavage (i.e., NeilMed) is recommended as needed and prior to medicated nasal sprays.  Chronic coughing Past history - Patient noted coughing with posttussive emesis at times for the last few years.  Using albuterol nebulizer less than once a week with good benefit.  History of pleurisy.  No prior COVID-19 infection.  Finished treatment for latent TB.  Normal chest x-ray in 2021.  Takes famotidine for reflux. 2022 spirometry was normal with 4% improvement in FEV1 post bronchodilator treatment.  Clinically feeling improved. Interim history - coughing a little now with the fall weather change. Did not try albuterol. Not wore after eating peanuts, soy or sesame. Starting in March 1st Start Airduo 2022 1 puff once a day and rinse mouth after each use. May take 1 puff twice a day if needed. Sample given and demonstrated proper use.  May use albuterol rescue inhaler 2 puffs or nebulizer every 4 to 6 hours as needed for shortness of breath, chest tightness, coughing, and wheezing. May use albuterol rescue inhaler 2 puffs 5 to 15 minutes prior to strenuous physical activities. Monitor frequency of use.  Get spirometry at next visit.  Heartburn Stable.  Continue lifestyle and dietary modifications. Continue famotine 20mg  twice a day. If coughing is not improved after 1 month then start omeprazole 40mg  once a day in the morning. Nothing to eat or drink for 30 minutes afterwards.   Return in about 6  months (around 09/01/2021).  Meds ordered this encounter  Medications   montelukast (SINGULAIR) 10 MG tablet    Sig: Take 1 tablet (10 mg total) by mouth at bedtime.    Dispense:  30 tablet    Refill:  5    Lab Orders  No  laboratory test(s) ordered today    Diagnostics: None.   Medication List:  Current Outpatient Medications  Medication Sig Dispense Refill   albuterol (VENTOLIN HFA) 108 (90 Base) MCG/ACT inhaler Inhale 2 puffs into the lungs every 4 (four) hours as needed for wheezing or shortness of breath (coughing fits). 18 g 1   azelastine (ASTELIN) 0.1 % nasal spray Place 1-2 sprays into both nostrils 2 (two) times daily as needed (nasal drainage). Use in each nostril as directed 30 mL 5   clindamycin (CLINDAGEL) 1 % gel Apply 1 application topically 2 (two) times daily.     clobetasol ointment (TEMOVATE) 0.05 % Apply topically 2 (two) times daily.     cyclobenzaprine (FLEXERIL) 10 MG tablet 1 TABLET THREE TIMES A DAY AS NEEDED ORALLY 30 DAY(S)     diphenhydrAMINE (BENADRYL) 25 MG tablet      escitalopram (LEXAPRO) 10 MG tablet Take 10 mg by mouth daily.     famotidine (PEPCID) 20 MG tablet      fluticasone (FLONASE) 50 MCG/ACT nasal spray Place 1 spray into both nostrils 2 (two) times daily as needed (nasal congestion). 16 g 5   JUNEL FE 1/20 1-20 MG-MCG tablet Take by mouth.     levocetirizine (XYZAL) 5 MG tablet      meloxicam (MOBIC) 15 MG tablet Take 15 mg by mouth daily.     Misc Natural Products (COSAMIN ASU FOR JOINT HEALTH) CAPS      Multiple Vitamin (MULTI-VITAMIN) tablet Take 1 tablet by mouth daily.     montelukast (SINGULAIR) 10 MG tablet Take 1 tablet (10 mg total) by mouth at bedtime. 30 tablet 5   No current facility-administered medications for this visit.   Allergies: No Known Allergies I reviewed her past medical history, social history, family history, and environmental history and no significant changes have been reported from her previous visit.  Review of Systems  Constitutional:  Negative for appetite change, chills, fever and unexpected weight change.  HENT:  Negative for congestion, postnasal drip and rhinorrhea.   Eyes:  Negative for itching.  Respiratory:  Positive  for cough. Negative for chest tightness, shortness of breath and wheezing.   Cardiovascular:  Negative for chest pain.  Gastrointestinal:  Negative for abdominal pain.  Genitourinary:  Negative for difficulty urinating.  Skin:  Negative for rash.  Allergic/Immunologic: Positive for environmental allergies.  Neurological:  Negative for headaches.   Objective: BP 114/78 (BP Location: Left Arm, Patient Position: Sitting, Cuff Size: Normal)   Pulse 82   Temp (!) 97.2 F (36.2 C) (Temporal)   Resp 16   SpO2 98%  There is no height or weight on file to calculate BMI. Physical Exam Vitals and nursing note reviewed.  Constitutional:      Appearance: Normal appearance. She is well-developed.  HENT:     Head: Normocephalic and atraumatic.     Right Ear: External ear normal.     Left Ear: External ear normal.     Nose: Nose normal.     Mouth/Throat:     Mouth: Mucous membranes are moist.     Pharynx: Oropharynx is clear.  Eyes:     Conjunctiva/sclera: Conjunctivae normal.  Cardiovascular:     Rate and Rhythm: Normal rate and regular rhythm.     Heart sounds: Normal heart sounds. No murmur heard.   No friction rub. No gallop.  Pulmonary:     Effort: Pulmonary effort is normal.     Breath sounds: Normal breath sounds. No wheezing, rhonchi or rales.  Abdominal:     Palpations: Abdomen is soft.  Musculoskeletal:     Cervical back: Neck supple.  Skin:    General: Skin is warm.     Findings: No rash.  Neurological:     Mental Status: She is alert and oriented to person, place, and time.  Psychiatric:        Behavior: Behavior normal.   Previous notes and tests were reviewed. The plan was reviewed with the patient/family, and all questions/concerned were addressed.  It was my pleasure to see Brooke Navarro today and participate in her care. Please feel free to contact me with any questions or concerns.  Sincerely,  Wyline Mood, DO Allergy & Immunology  [facility] office: [phone] [facility] office: [phone]

## 2021-03-04 NOTE — Patient Instructions (Addendum)
Environmental allergies 2022 skin testing showed: Positive to grass, ragweed, weed, trees, dust mites, cat, dog, mouse. Borderline to mold.  Continue environmental control measures as below. Use over the counter antihistamines such as Zyrtec (cetirizine), Claritin (loratadine), Allegra (fexofenadine), or Xyzal (levocetirizine) daily as needed. May take twice a day during allergy flares. May switch antihistamines every few months. Continue Singulair (montelukast) 10mg  daily at night. Use Flonase (fluticasone) nasal spray 1 spray per nostril twice a day as needed for nasal congestion.  Use azelastine nasal spray 1-2 sprays per nostril twice a day as needed for runny nose/drainage. Continue allergy injections. Nasal saline spray (i.e., Simply Saline) or nasal saline lavage (i.e., NeilMed) is recommended as needed and prior to medicated nasal sprays.  Coughing: Starting in March 1st Start Airduo 12-24-1983 1 puff once a day and rinse mouth after each use. May take 1 puff twice a day if needed. Sample given and demonstrated proper use.  May use albuterol rescue inhaler 2 puffs or nebulizer every 4 to 6 hours as needed for shortness of breath, chest tightness, coughing, and wheezing. May use albuterol rescue inhaler 2 puffs 5 to 15 minutes prior to strenuous physical activities. Monitor frequency of use.   Heartburn: Continue lifestyle and dietary modifications. Continue famotine 20mg  twice a day. If coughing is not improved after 1 month then start omeprazole 40mg  once a day in the morning. Nothing to eat or drink for 30 minutes afterwards.   Follow up in 6 months or sooner if needed.   Reducing Pollen Exposure Pollen seasons: trees (spring), grass (summer) and ragweed/weeds (fall). Keep windows closed in your home and car to lower pollen exposure.  Install air conditioning in the bedroom and throughout the house if possible.  Avoid going out in dry windy days - especially early morning. Pollen  counts are highest between 5 - 10 AM and on dry, hot and windy days.  Save outside activities for late afternoon or after a heavy rain, when pollen levels are lower.  Avoid mowing of grass if you have grass pollen allergy. Be aware that pollen can also be transported indoors on people and pets.  Dry your clothes in an automatic dryer rather than hanging them outside where they might collect pollen.  Rinse hair and eyes before bedtime. Control of House Dust Mite Allergen Dust mite allergens are a common trigger of allergy and asthma symptoms. While they can be found throughout the house, these microscopic creatures thrive in warm, humid environments such as bedding, upholstered furniture and carpeting. Because so much time is spent in the bedroom, it is essential to reduce mite levels there.  Encase pillows, mattresses, and box springs in special allergen-proof fabric covers or airtight, zippered plastic covers.  Bedding should be washed weekly in hot water (130 F) and dried in a hot dryer. Allergen-proof covers are available for comforters and pillows that can't be regularly washed.  Wash the allergy-proof covers every few months. Minimize clutter in the bedroom. Keep pets out of the bedroom.  Keep humidity less than 50% by using a dehumidifier or air conditioning. You can buy a humidity measuring device called a hygrometer to monitor this.  If possible, replace carpets with hardwood, linoleum, or washable area rugs. If that's not possible, vacuum frequently with a vacuum that has a HEPA filter. Remove all upholstered furniture and non-washable window drapes from the bedroom. Remove all non-washable stuffed toys from the bedroom.  Wash stuffed toys weekly. Pet Allergen Avoidance: Contrary to popular opinion,  there are no "hypoallergenic" breeds of dogs or cats. That is because people are not allergic to an animal's hair, but to an allergen found in the animal's saliva, dander (dead skin flakes) or  urine. Pet allergy symptoms typically occur within minutes. For some people, symptoms can build up and become most severe 8 to 12 hours after contact with the animal. People with severe allergies can experience reactions in public places if dander has been transported on the pet owners' clothing. Keeping an animal outdoors is only a partial solution, since homes with pets in the yard still have higher concentrations of animal allergens. Before getting a pet, ask your allergist to determine if you are allergic to animals. If your pet is already considered part of your family, try to minimize contact and keep the pet out of the bedroom and other rooms where you spend a great deal of time. As with dust mites, vacuum carpets often or replace carpet with a hardwood floor, tile or linoleum. High-efficiency particulate air (HEPA) cleaners can reduce allergen levels over time. While dander and saliva are the source of cat and dog allergens, urine is the source of allergens from rabbits, hamsters, mice and [place] pigs; so ask a non-allergic family member to clean the animal's cage. If you have a pet allergy, talk to your allergist about the potential for allergy immunotherapy (allergy shots). This strategy can often provide long-term relief. Mold Control Mold and fungi can grow on a variety of surfaces provided certain temperature and moisture conditions exist.  Outdoor molds grow on plants, decaying vegetation and soil. The major outdoor mold, Alternaria and Cladosporium, are found in very high numbers during hot and dry conditions. Generally, a late summer - fall peak is seen for common outdoor fungal spores. Rain will temporarily lower outdoor mold spore count, but counts rise rapidly when the rainy period ends. The most important indoor molds are Aspergillus and Penicillium. Dark, humid and poorly ventilated basements are ideal sites for mold growth. The next most common sites of mold growth are the bathroom  and the kitchen. Outdoor (Seasonal) Mold Control Use air conditioning and keep windows closed. Avoid exposure to decaying vegetation. Avoid leaf raking. Avoid grain handling. Consider wearing a face mask if working in moldy areas.  Indoor (Perennial) Mold Control  Maintain humidity below 50%. Get rid of mold growth on hard surfaces with water, detergent and, if necessary, 5% bleach (do not mix with other cleaners). Then dry the area completely. If mold covers an area more than 10 square feet, consider hiring an indoor environmental professional. For clothing, washing with soap and water is best. If moldy items cannot be cleaned and dried, throw them away. Remove sources e.g. contaminated carpets. Repair and seal leaking roofs or pipes. Using dehumidifiers in damp basements may be helpful, but empty the water and clean units regularly to prevent mildew from forming. All rooms, especially basements, bathrooms and kitchens, require ventilation and cleaning to deter mold and mildew growth. Avoid carpeting on concrete or damp floors, and storing items in damp areas.

## 2021-03-04 NOTE — Assessment & Plan Note (Signed)
Stable.   Continue lifestyle and dietary modifications.  Continue famotine 20mg  twice a day.  If coughing is not improved after 1 month then start omeprazole 40mg  once a day in the morning. Nothing to eat or drink for 30 minutes afterwards.

## 2021-03-04 NOTE — Assessment & Plan Note (Signed)
Past history - Perennial rhinoconjunctivitis symptoms with worsening the fall and winter - coughing is most bothersome.  2022 skin testing showed: Positive to grass, ragweed, weed, trees, dust mites, cat, dog, mouse. Borderline to mold. Interim history - started AIT on 02/16/2021 (G-W-RW-T, DM-C-D)  Continue environmental control measures as below.  Use over the counter antihistamines such as Zyrtec (cetirizine), Claritin (loratadine), Allegra (fexofenadine), or Xyzal (levocetirizine) daily as needed. May take twice a day during allergy flares. May switch antihistamines every few months.  Continue Singulair (montelukast) 10mg  daily at night. . Use Flonase (fluticasone) nasal spray 1 spray per nostril twice a day as needed for nasal congestion.   Use azelastine nasal spray 1-2 sprays per nostril twice a day as needed for runny nose/drainage.  Continue allergy injections.  Nasal saline spray (i.e., Simply Saline) or nasal saline lavage (i.e., NeilMed) is recommended as needed and prior to medicated nasal sprays.

## 2021-03-09 ENCOUNTER — Ambulatory Visit (INDEPENDENT_AMBULATORY_CARE_PROVIDER_SITE_OTHER): Payer: 59 | Admitting: *Deleted

## 2021-03-09 DIAGNOSIS — J309 Allergic rhinitis, unspecified: Secondary | ICD-10-CM | POA: Diagnosis not present

## 2021-03-16 ENCOUNTER — Ambulatory Visit (INDEPENDENT_AMBULATORY_CARE_PROVIDER_SITE_OTHER): Payer: 59

## 2021-03-16 DIAGNOSIS — J309 Allergic rhinitis, unspecified: Secondary | ICD-10-CM

## 2021-03-23 ENCOUNTER — Ambulatory Visit (INDEPENDENT_AMBULATORY_CARE_PROVIDER_SITE_OTHER): Payer: 59

## 2021-03-23 DIAGNOSIS — J309 Allergic rhinitis, unspecified: Secondary | ICD-10-CM

## 2021-03-30 ENCOUNTER — Ambulatory Visit (INDEPENDENT_AMBULATORY_CARE_PROVIDER_SITE_OTHER): Payer: 59 | Admitting: *Deleted

## 2021-03-30 DIAGNOSIS — J309 Allergic rhinitis, unspecified: Secondary | ICD-10-CM | POA: Diagnosis not present

## 2021-04-08 ENCOUNTER — Ambulatory Visit (INDEPENDENT_AMBULATORY_CARE_PROVIDER_SITE_OTHER): Payer: 59 | Admitting: *Deleted

## 2021-04-08 DIAGNOSIS — J309 Allergic rhinitis, unspecified: Secondary | ICD-10-CM

## 2021-04-13 ENCOUNTER — Ambulatory Visit (INDEPENDENT_AMBULATORY_CARE_PROVIDER_SITE_OTHER): Payer: 59 | Admitting: *Deleted

## 2021-04-13 DIAGNOSIS — J309 Allergic rhinitis, unspecified: Secondary | ICD-10-CM

## 2021-04-20 ENCOUNTER — Ambulatory Visit (INDEPENDENT_AMBULATORY_CARE_PROVIDER_SITE_OTHER): Payer: 59 | Admitting: *Deleted

## 2021-04-20 DIAGNOSIS — J309 Allergic rhinitis, unspecified: Secondary | ICD-10-CM | POA: Diagnosis not present

## 2021-04-27 ENCOUNTER — Ambulatory Visit (INDEPENDENT_AMBULATORY_CARE_PROVIDER_SITE_OTHER): Payer: 59 | Admitting: *Deleted

## 2021-04-27 DIAGNOSIS — J309 Allergic rhinitis, unspecified: Secondary | ICD-10-CM | POA: Diagnosis not present

## 2021-05-04 ENCOUNTER — Ambulatory Visit (INDEPENDENT_AMBULATORY_CARE_PROVIDER_SITE_OTHER): Payer: 59 | Admitting: *Deleted

## 2021-05-04 DIAGNOSIS — J309 Allergic rhinitis, unspecified: Secondary | ICD-10-CM

## 2021-05-11 ENCOUNTER — Ambulatory Visit (INDEPENDENT_AMBULATORY_CARE_PROVIDER_SITE_OTHER): Payer: 59

## 2021-05-11 DIAGNOSIS — J309 Allergic rhinitis, unspecified: Secondary | ICD-10-CM

## 2021-05-18 ENCOUNTER — Ambulatory Visit (INDEPENDENT_AMBULATORY_CARE_PROVIDER_SITE_OTHER): Payer: 59 | Admitting: *Deleted

## 2021-05-18 DIAGNOSIS — J309 Allergic rhinitis, unspecified: Secondary | ICD-10-CM

## 2021-05-25 ENCOUNTER — Ambulatory Visit (INDEPENDENT_AMBULATORY_CARE_PROVIDER_SITE_OTHER): Payer: 59 | Admitting: *Deleted

## 2021-05-25 DIAGNOSIS — J309 Allergic rhinitis, unspecified: Secondary | ICD-10-CM | POA: Diagnosis not present

## 2021-06-03 ENCOUNTER — Ambulatory Visit (INDEPENDENT_AMBULATORY_CARE_PROVIDER_SITE_OTHER): Payer: 59 | Admitting: *Deleted

## 2021-06-03 DIAGNOSIS — J309 Allergic rhinitis, unspecified: Secondary | ICD-10-CM

## 2021-06-10 ENCOUNTER — Ambulatory Visit (INDEPENDENT_AMBULATORY_CARE_PROVIDER_SITE_OTHER): Payer: 59

## 2021-06-10 DIAGNOSIS — J309 Allergic rhinitis, unspecified: Secondary | ICD-10-CM

## 2021-06-14 ENCOUNTER — Other Ambulatory Visit: Payer: Self-pay | Admitting: Obstetrics and Gynecology

## 2021-06-14 DIAGNOSIS — Z1231 Encounter for screening mammogram for malignant neoplasm of breast: Secondary | ICD-10-CM

## 2021-06-18 ENCOUNTER — Ambulatory Visit (INDEPENDENT_AMBULATORY_CARE_PROVIDER_SITE_OTHER): Payer: 59

## 2021-06-18 DIAGNOSIS — J309 Allergic rhinitis, unspecified: Secondary | ICD-10-CM | POA: Diagnosis not present

## 2021-06-24 ENCOUNTER — Ambulatory Visit (INDEPENDENT_AMBULATORY_CARE_PROVIDER_SITE_OTHER): Payer: 59

## 2021-06-24 DIAGNOSIS — J309 Allergic rhinitis, unspecified: Secondary | ICD-10-CM | POA: Diagnosis not present

## 2021-07-02 ENCOUNTER — Ambulatory Visit (INDEPENDENT_AMBULATORY_CARE_PROVIDER_SITE_OTHER): Payer: 59

## 2021-07-02 DIAGNOSIS — J309 Allergic rhinitis, unspecified: Secondary | ICD-10-CM

## 2021-07-06 ENCOUNTER — Ambulatory Visit (INDEPENDENT_AMBULATORY_CARE_PROVIDER_SITE_OTHER): Payer: 59 | Admitting: *Deleted

## 2021-07-06 ENCOUNTER — Other Ambulatory Visit: Payer: Self-pay | Admitting: *Deleted

## 2021-07-06 DIAGNOSIS — J309 Allergic rhinitis, unspecified: Secondary | ICD-10-CM | POA: Diagnosis not present

## 2021-07-06 MED ORDER — ALBUTEROL SULFATE HFA 108 (90 BASE) MCG/ACT IN AERS: 2.0000 | INHALATION_SPRAY | RESPIRATORY_TRACT | 1 refills | Status: DC | PRN

## 2021-07-06 MED ORDER — MONTELUKAST SODIUM 10 MG PO TABS: 10.0000 mg | ORAL_TABLET | Freq: Every day | ORAL | 5 refills | Status: DC

## 2021-07-06 MED ORDER — AZELASTINE HCL 0.1 % NA SOLN: 1.0000 | Freq: Two times a day (BID) | NASAL | 5 refills | Status: DC | PRN

## 2021-07-09 ENCOUNTER — Ambulatory Visit
Admission: RE | Admit: 2021-07-09 | Discharge: 2021-07-09 | Disposition: A | Discharge status: Home / Self Care | Payer: 59 | Source: Ambulatory Visit | Attending: Obstetrics and Gynecology | Admitting: Obstetrics and Gynecology

## 2021-07-09 DIAGNOSIS — Z1231 Encounter for screening mammogram for malignant neoplasm of breast: Secondary | ICD-10-CM

## 2021-07-13 ENCOUNTER — Ambulatory Visit (INDEPENDENT_AMBULATORY_CARE_PROVIDER_SITE_OTHER): Payer: 59 | Admitting: *Deleted

## 2021-07-13 DIAGNOSIS — J309 Allergic rhinitis, unspecified: Secondary | ICD-10-CM | POA: Diagnosis not present

## 2021-07-20 ENCOUNTER — Ambulatory Visit (INDEPENDENT_AMBULATORY_CARE_PROVIDER_SITE_OTHER): Payer: 59 | Admitting: *Deleted

## 2021-07-20 DIAGNOSIS — J309 Allergic rhinitis, unspecified: Secondary | ICD-10-CM

## 2021-07-29 ENCOUNTER — Ambulatory Visit (INDEPENDENT_AMBULATORY_CARE_PROVIDER_SITE_OTHER): Payer: 59

## 2021-07-29 DIAGNOSIS — J309 Allergic rhinitis, unspecified: Secondary | ICD-10-CM | POA: Diagnosis not present

## 2021-08-04 IMAGING — US US BREAST*L* LIMITED INC AXILLA
1 series · 5 of 5 positions shown · non-contrast
Comparison: Previous exam(s).

CLINICAL DATA: The patient was called back for a left breast
asymmetry only seen on the MLO view.

EXAM:
DIGITAL DIAGNOSTIC LEFT MAMMOGRAM WITH TOMO
ULTRASOUND LEFT BREAST

[Series 1: us breast*left* limited inc axilla · 0.06mm/px · 5 of 5 slices shown]
[im 1/5]
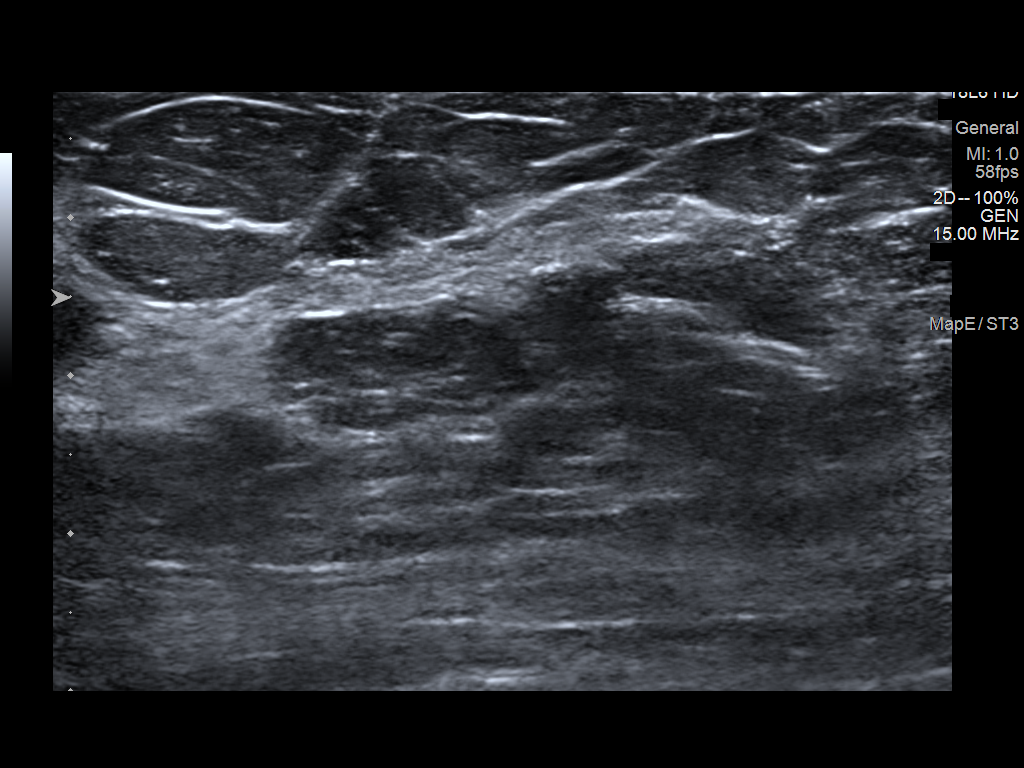
[im 2/5]
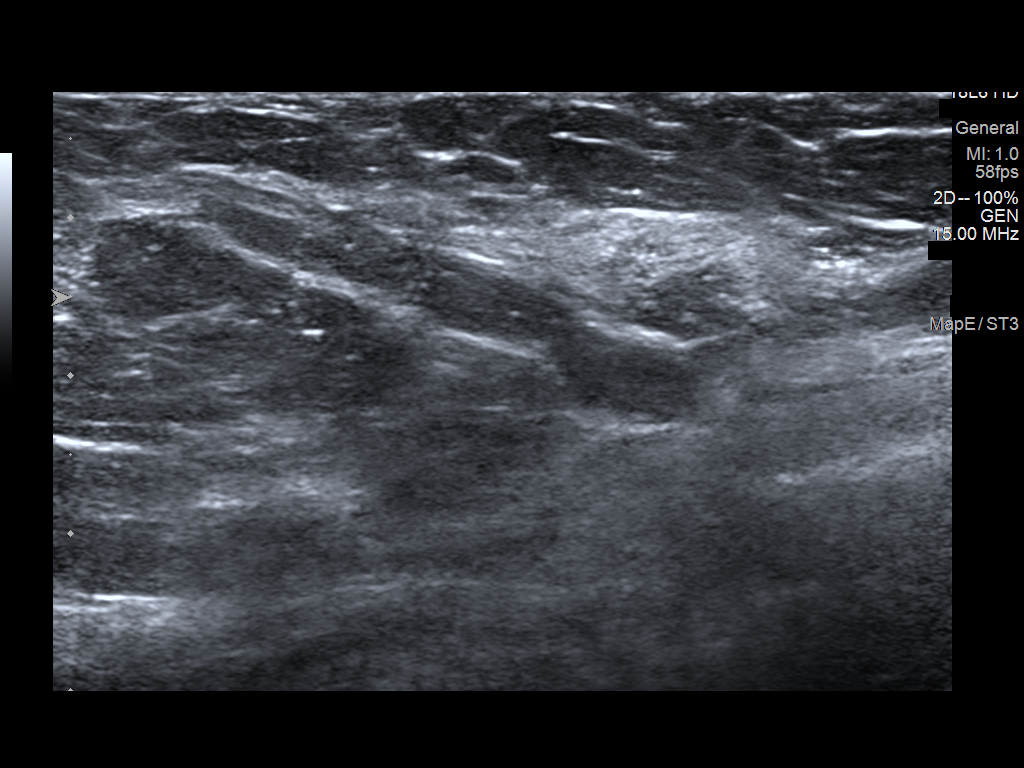
[im 3/5]
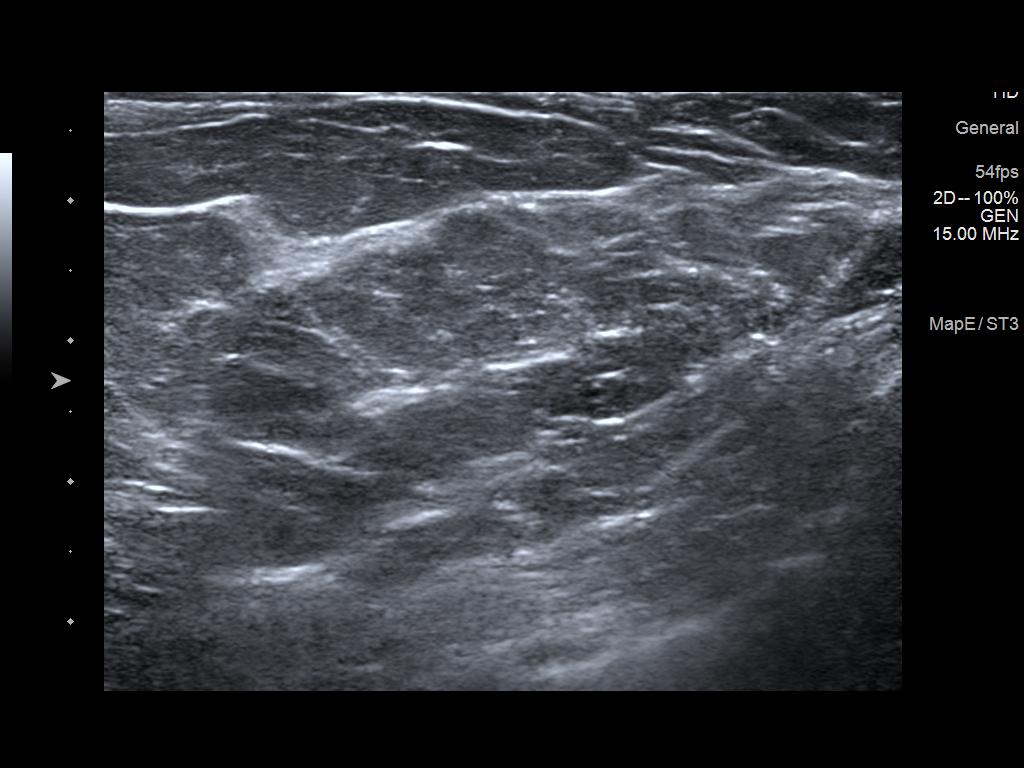
[im 4/5]
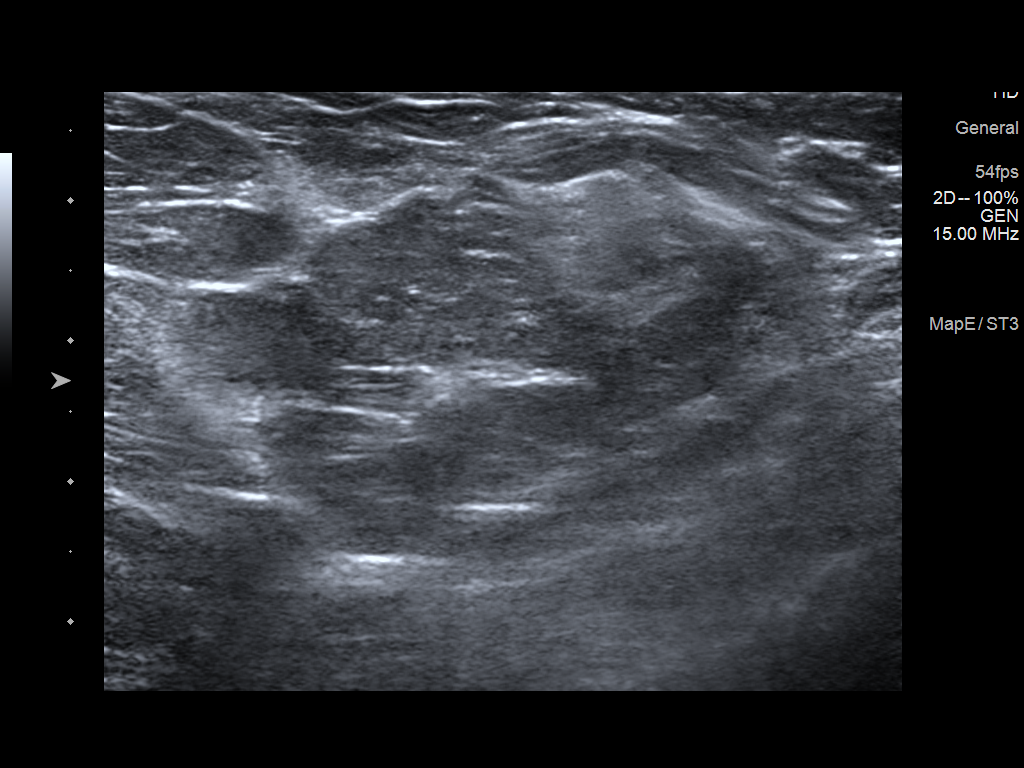
[im 5/5]
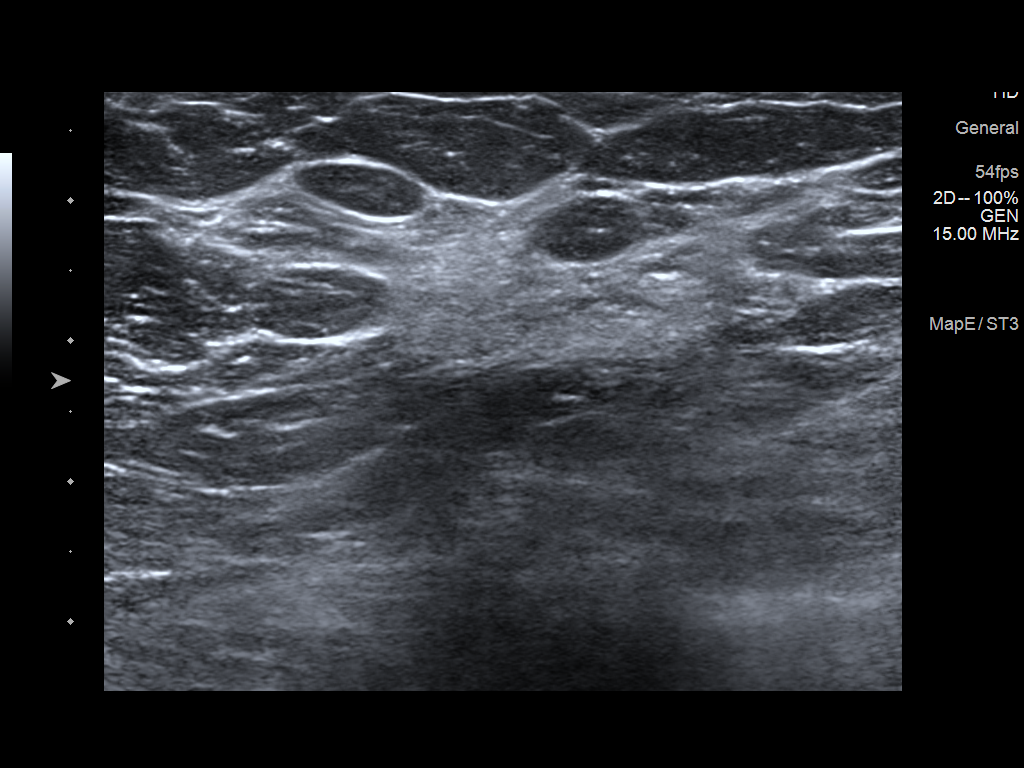

[5 of 5 positions shown; findings below may reference images not displayed]

ACR Breast Density Category b: There are scattered areas of
fibroglandular density.
FINDINGS: The left breast asymmetry resolves on today's imaging.

On physical exam, no suspicious lumps are identified.

Targeted ultrasound is performed, showing no sonographic correlate
to the left breast asymmetry.
IMPRESSION: No mammographic or sonographic evidence of malignancy.

RECOMMENDATION:
Annual screening mammography.

I have discussed the findings and recommendations with the patient.
If applicable, a reminder letter will be sent to the patient
regarding the next appointment.

BI-RADS CATEGORY  1: Negative.

## 2021-08-05 ENCOUNTER — Ambulatory Visit (INDEPENDENT_AMBULATORY_CARE_PROVIDER_SITE_OTHER): Payer: 59

## 2021-08-05 DIAGNOSIS — J309 Allergic rhinitis, unspecified: Secondary | ICD-10-CM | POA: Diagnosis not present

## 2021-08-12 ENCOUNTER — Ambulatory Visit (INDEPENDENT_AMBULATORY_CARE_PROVIDER_SITE_OTHER): Payer: 59

## 2021-08-12 DIAGNOSIS — J309 Allergic rhinitis, unspecified: Secondary | ICD-10-CM

## 2021-08-24 ENCOUNTER — Ambulatory Visit (INDEPENDENT_AMBULATORY_CARE_PROVIDER_SITE_OTHER): Payer: 59

## 2021-08-24 ENCOUNTER — Emergency Department
Admission: EM | Admit: 2021-08-24 | Discharge: 2021-08-24 | Disposition: A | Discharge status: Home / Self Care | Payer: 59 | Source: Home / Self Care

## 2021-08-24 ENCOUNTER — Other Ambulatory Visit: Payer: Self-pay

## 2021-08-24 DIAGNOSIS — J309 Allergic rhinitis, unspecified: Secondary | ICD-10-CM | POA: Diagnosis not present

## 2021-08-24 DIAGNOSIS — R21 Rash and other nonspecific skin eruption: Secondary | ICD-10-CM

## 2021-08-24 DIAGNOSIS — L509 Urticaria, unspecified: Secondary | ICD-10-CM

## 2021-08-24 MED ORDER — METHYLPREDNISOLONE 4 MG PO TBPK: ORAL_TABLET | ORAL | 0 refills | Status: DC

## 2021-08-24 MED ORDER — FEXOFENADINE HCL 180 MG PO TABS: 180.0000 mg | ORAL_TABLET | Freq: Every day | ORAL | 0 refills | Status: DC

## 2021-08-24 MED ORDER — METHYLPREDNISOLONE SODIUM SUCC 125 MG IJ SOLR
125.0000 mg | Freq: Once | INTRAMUSCULAR | Status: AC
  Administered 2021-08-24: 125 mg via INTRAMUSCULAR

## 2021-08-24 NOTE — ED Triage Notes (Signed)
Pt here today for hives that started earlier today. Gets allergy shots weekly. Got some today. But had new pest control come to house and spray, sat on porch couch and started having a rxn within 10 mins. Benedryl about an hour ago.  ?

## 2021-08-24 NOTE — Discharge Instructions (Addendum)
Advised patient to take medication as directed with food to completion.  Advised patient to take Allegra daily beginning tonight and for the next 4 to 5 days.  Advised patient to start Medrol Dosepak tomorrow morning, Wednesday, 08/25/2021.  Encouraged patient to increase daily water intake while taking these medications.  Advised patient if symptoms worsen and/or unresolved please follow-up with PCP or here for further evaluation. ?

## 2021-08-24 NOTE — ED Provider Notes (Signed)
?[facility] ? ? ? ?CSN: [id number] ?Arrival date & time: 08/24/21  1929 ? ? ?  ? ?History   ?Chief Complaint ?Chief Complaint  ?Patient presents with  ? Allergic Reaction  ? ? ?HPI ?Brooke Navarro is a 46 y.o. female.  ? ?HPI 46 year old female presents with allergic reaction.  Reports pest control came to house and sprayed porch couch which she sat on and started having reaction (hives outbreak) within 10 minutes.  MH significant for chronic coughing and residual pleurisy. ? ?Past Medical History:  ?Diagnosis Date  ? Allergic rhinitis   ? Arthritis   ? ? ?Patient Active Problem List  ? Diagnosis Date Noted  ? Seasonal and perennial allergic rhinoconjunctivitis 03/04/2021  ? Heartburn 12/24/2020  ? Chronic coughing 12/24/2020  ? Pleurisy, residual 09/09/2011  ? ? ?Past Surgical History:  ?Procedure Laterality Date  ? KNEE SURGERY  11/2009  ? left  ? ? ?OB History   ?No obstetric history on file. ?  ? ? ? ?Home Medications   ? ?Prior to Admission medications   ?Medication Sig Start Date End Date Taking? Authorizing Provider  ?fexofenadine (ALLEGRA ALLERGY) 180 MG tablet Take 1 tablet (180 mg total) by mouth daily for 15 days. 08/24/21 09/08/21 Yes Eliezer Lofts, FNP  ?methylPREDNISolone (MEDROL DOSEPAK) 4 MG TBPK tablet Take as directed 08/24/21  Yes Eliezer Lofts, FNP  ?albuterol (VENTOLIN HFA) 108 (90 Base) MCG/ACT inhaler Inhale 2 puffs into the lungs every 4 (four) hours as needed for wheezing or shortness of breath (coughing fits). 07/06/21   Garnet Sierras, DO  ?azelastine (ASTELIN) 0.1 % nasal spray Place 1-2 sprays into both nostrils 2 (two) times daily as needed (nasal drainage). Use in each nostril as directed 07/06/21   Garnet Sierras, DO  ?clindamycin (CLINDAGEL) 1 % gel Apply 1 application topically 2 (two) times daily. 12/21/20   [provider]  ?clobetasol ointment (TEMOVATE) 0.05 % Apply topically 2 (two) times daily. 12/17/20   [provider]  ?cyclobenzaprine (FLEXERIL) 10 MG tablet  1 TABLET THREE TIMES A DAY AS NEEDED ORALLY 30 DAY(S) 08/26/15   [provider]  ?diphenhydrAMINE (BENADRYL) 25 MG tablet     [provider]  ?escitalopram (LEXAPRO) 10 MG tablet Take 10 mg by mouth daily. 09/22/20   [provider]  ?famotidine (PEPCID) 20 MG tablet     [provider]  ?fluticasone (FLONASE) 50 MCG/ACT nasal spray Place 1 spray into both nostrils 2 (two) times daily as needed (nasal congestion). 12/24/20   Garnet Sierras, DO  ?JUNEL FE 1/20 1-20 MG-MCG tablet Take by mouth. 12/03/20   [provider]  ?levocetirizine (XYZAL) 5 MG tablet     [provider]  ?meloxicam (MOBIC) 15 MG tablet Take 15 mg by mouth daily. 08/04/20   [provider]  ?Misc Natural Products ([place]) CAPS     [provider]  ?montelukast (SINGULAIR) 10 MG tablet Take 1 tablet (10 mg total) by mouth at bedtime. 07/06/21   Garnet Sierras, DO  ?Multiple Vitamin (MULTI-VITAMIN) tablet Take 1 tablet by mouth daily.    [provider]  ? ? ?Family History ?Family History  ?Problem Relation Age of Onset  ? Allergic rhinitis Mother   ? Allergic rhinitis Father   ? Urticaria Father   ? Allergic rhinitis Sister   ? Urticaria Sister   ? Asthma Sister   ? Allergic rhinitis Brother   ? Breast cancer  Maternal Grandmother   ? Melanoma Maternal Grandfather   ? Heart disease Paternal Grandmother   ? Lung cancer Paternal Grandfather   ? ? ?Social History ?Social History  ? ?Tobacco Use  ? Smoking status: Never  ?  Passive exposure: Current  ? Smokeless tobacco: Never  ?Vaping Use  ? Vaping Use: Never used  ?Substance Use Topics  ? Alcohol use: Yes  ? Drug use: Never  ? ? ? ?Allergies   ?Patient has no known allergies. ? ? ?Review of Systems ?Review of Systems  ?Skin:  Positive for rash.  ? ? ?Physical Exam ?Triage Vital Signs ?ED Triage Vitals  ?Enc Vitals Group  ?   BP 08/24/21 1945 (!) 154/88  ?   Pulse Rate 08/24/21 1945 95  ?   Resp 08/24/21 1945  18  ?   Temp 08/24/21 1945 98.5 ?F (36.9 ?C)  ?   Temp Source 08/24/21 1945 Oral  ?   SpO2 08/24/21 1945 100 %  ?   Weight --   ?   Height --   ?   Head Circumference --   ?   Peak Flow --   ?   Pain Score 08/24/21 1948 0  ?   Pain Loc --   ?   Pain Edu? --   ?   Excl. in [place]? --   ? ?No data found. ? ?Updated Vital Signs ?BP (!) 154/88 (BP Location: Right Arm)   Pulse 95   Temp 98.5 ?F (36.9 ?C) (Oral)   Resp 18   SpO2 100%  ? ?  ? ?Physical Exam ?Vitals and nursing note reviewed.  ?Constitutional:   ?   General: She is not in acute distress. ?   Appearance: Normal appearance. She is obese. She is not ill-appearing.  ?HENT:  ?   Head: Normocephalic and atraumatic.  ?   Mouth/Throat:  ?   Mouth: Mucous membranes are moist.  ?   Pharynx: Oropharynx is clear.  ?Eyes:  ?   Extraocular Movements: Extraocular movements intact.  ?   Conjunctiva/sclera: Conjunctivae normal.  ?   Pupils: Pupils are equal, round, and reactive to light.  ?Cardiovascular:  ?   Rate and Rhythm: Normal rate and regular rhythm.  ?   Pulses: Normal pulses.  ?   Heart sounds: Normal heart sounds.  ?Pulmonary:  ?   Effort: Pulmonary effort is normal.  ?   Breath sounds: Normal breath sounds. No wheezing, rhonchi or rales.  ?Musculoskeletal:  ?   Cervical back: Normal range of motion and neck supple.  ?Skin: ?   General: Skin is warm and dry.  ?   Comments: Anterior/posterior neck, abdomen, torso: Erythematous superficial, widespread, and confluent plaques with clear dusky centers, borders are annular, extremely pruritic per patient.  ?Neurological:  ?   General: No focal deficit present.  ?   Mental Status: She is alert and oriented to person, place, and time.  ? ? ? ?UC Treatments / Results  ?Labs ?(all labs ordered are listed, but only abnormal results are displayed) ?Labs Reviewed - No data to display ? ?EKG ? ? ?Radiology ?No results found. ? ?Procedures ?Procedures (including critical care time) ? ?Medications Ordered in UC ?Medications   ?methylPREDNISolone sodium succinate (SOLU-MEDROL) 125 mg/2 mL injection 125 mg (125 mg Intramuscular Given 08/24/21 2053)  ? ? ?Initial Impression / Assessment and Plan / UC Course  ?I have reviewed the triage vital signs and the nursing notes. ? ?Pertinent labs &  imaging results that were available during my care of the patient were reviewed by me and considered in my medical decision making (see chart for details). ? ?  ? ?MDM: 1.  Urticaria-Rx'd Allegra; 2.  Rash and nonspecific skin eruption-IM Solu-Medrol 125 mg given once in clinic prior to discharge tonight, Rx'd Medrol Dosepak. Advised patient to take medication as directed with food to completion.  Advised patient to take Allegra daily beginning tonight and for the next 4 to 5 days.  Advised patient to start Medrol Dosepak tomorrow morning, Wednesday, 08/25/2021.  Encouraged patient to increase daily water intake while taking these medications.  Advised patient if symptoms worsen and/or unresolved please follow-up with PCP or here for further evaluation.  Patient discharged home, hemodynamically stable ?Final Clinical Impressions(s) / UC Diagnoses  ? ?Final diagnoses:  ?Urticaria  ?Rash and nonspecific skin eruption  ? ? ? ?Discharge Instructions   ? ?  ?Advised patient to take medication as directed with food to completion.  Advised patient to take Allegra daily beginning tonight and for the next 4 to 5 days.  Advised patient to start Medrol Dosepak tomorrow morning, Wednesday, 08/25/2021.  Encouraged patient to increase daily water intake while taking these medications.  Advised patient if symptoms worsen and/or unresolved please follow-up with PCP or here for further evaluation. ? ? ? ? ?ED Prescriptions   ? ? Medication Sig Dispense Auth. Provider  ? methylPREDNISolone (MEDROL DOSEPAK) 4 MG TBPK tablet Take as directed 1 each Eliezer Lofts, FNP  ? fexofenadine (ALLEGRA ALLERGY) 180 MG tablet Take 1 tablet (180 mg total) by mouth daily for 15 days. 15  tablet Eliezer Lofts, FNP  ? ?  ? ?PDMP not reviewed this encounter. ?  ?Eliezer Lofts, [place] ?08/24/21 2059 ? ?

## 2021-08-31 ENCOUNTER — Ambulatory Visit: Payer: 59 | Admitting: Allergy

## 2021-08-31 ENCOUNTER — Other Ambulatory Visit: Payer: Self-pay

## 2021-08-31 ENCOUNTER — Encounter: Payer: Self-pay | Admitting: Allergy

## 2021-08-31 VITALS — BP 110/68 | HR 96 | Temp 98.0°F | Resp 16

## 2021-08-31 DIAGNOSIS — J45991 Cough variant asthma: Secondary | ICD-10-CM | POA: Diagnosis not present

## 2021-08-31 DIAGNOSIS — K219 Gastro-esophageal reflux disease without esophagitis: Secondary | ICD-10-CM | POA: Diagnosis not present

## 2021-08-31 DIAGNOSIS — H1013 Acute atopic conjunctivitis, bilateral: Secondary | ICD-10-CM | POA: Diagnosis not present

## 2021-08-31 DIAGNOSIS — J302 Other seasonal allergic rhinitis: Secondary | ICD-10-CM

## 2021-08-31 MED ORDER — RYALTRIS 665-25 MCG/ACT NA SUSP: 1.0000 | Freq: Two times a day (BID) | NASAL | 5 refills | Status: DC

## 2021-08-31 MED ORDER — AIRDUO DIGIHALER 113-14 MCG/ACT IN AEPB: INHALATION_SPRAY | RESPIRATORY_TRACT | 5 refills | Status: DC

## 2021-08-31 NOTE — Assessment & Plan Note (Signed)
Past history - Perennial rhinoconjunctivitis symptoms with worsening the fall and winter - coughing is most bothersome.  2022 skin testing showed: Positive to grass, ragweed, weed, trees, dust mites, cat, dog, mouse. Borderline to mold. Started AIT on 02/16/2021 (G-W-RW-T, DM-C-D) ?Interim history - localized reaction to pollen vial. ?? Continue environmental control measures. ?? Use over the counter antihistamines such as Zyrtec (cetirizine), Claritin (loratadine), Allegra (fexofenadine), or Xyzal (levocetirizine) daily as needed. May take twice a day during allergy flares. May switch antihistamines every few months. ?? Continue Singulair (montelukast) 10mg  daily at night. ?? Start Ryaltris (olopatadine + mometasone nasal spray combination) 1-2 sprays per nostril twice a day. Sample given. ?? Prescription sent in. If it doesn't work that well then okay to continue with Flonase and azelastine as before.  ?? Continue allergy injections. ?? Nasal saline spray (i.e., Simply Saline) or nasal saline lavage (i.e., NeilMed) is recommended as needed and prior to medicated nasal sprays. ?

## 2021-08-31 NOTE — Assessment & Plan Note (Signed)
Stable.  ?? Continue lifestyle and dietary modifications. ?? Continue famotine 20mg  twice a day. ?

## 2021-08-31 NOTE — Assessment & Plan Note (Signed)
Past history - Patient noted coughing with posttussive emesis at times for the last few years.  Using albuterol nebulizer less than once a week with good benefit.  History of pleurisy.  No prior COVID-19 infection.  Finished treatment for latent TB.  Normal chest x-ray in 2021.  Takes famotidine for reflux. 2022 spirometry was normal with 4% improvement in FEV1 post bronchodilator treatment.  Clinically feeling improved. ?Interim history - started Airduo and has less coughing this spring. ?? Today's spirometry was normal - not as good as previous one. ?? Starting in the spring and fall.  ?o Start Airduo 145mcg 1 puff once a day and rinse mouth after each use. May take 1 puff twice a day if needed. ?? May use albuterol rescue inhaler 2 puffs or nebulizer every 4 to 6 hours as needed for shortness of breath, chest tightness, coughing, and wheezing. May use albuterol rescue inhaler 2 puffs 5 to 15 minutes prior to strenuous physical activities. Monitor frequency of use.  ?? Get spirometry at next visit. ?

## 2021-08-31 NOTE — Progress Notes (Signed)
? ?Follow Up Note ? ?RE: Brooke Navarro MRN: [id number] DOB: 07/20/1975 ?Date of Office Visit: 08/31/2021 ? ?Referring provider: Tally Joe, MD ?Primary care provider: Tally Joe, MD ? ?Chief Complaint: Allergic Rhinitis  ? ?History of Present Illness: ?I had the pleasure of seeing Brooke Navarro for a follow up visit at the [facility] on 08/31/2021. She is a 46 y.o. female, who is being followed for allergic rhinoconjunctivitis on AIT, coughing, heartburn. Her previous allergy office visit was on 03/04/2021 with Dr. Selena Batten. Today is a regular follow up visit. ? ?Seasonal and perennial allergic rhinoconjunctivitis ?Receiving AIT and has some localized reaction with the pollen vial. ?Currently on Singulair daily, Xyzal daily, azelastine nasal spray prn and Flonase prn with god benefit. No nosebleeds. ? ?Patient had a contact rash earlier this month. Not sure if it was due to the dog getting into something or pesticide spray. She got steroid injection and oral prednisone which helped.  ?  ?Coughing ?Coughing once in a while.  ?Started Airduo 1 puff once a day x 2 weeks and the coughing is less this spring.  ? ?Heartburn ?Stable.  ? ?08/24/2021 UC visit: ?"HPI 46 year old female presents with allergic reaction.  Reports pest control came to house and sprayed porch couch which she sat on and started having reaction (hives outbreak) within 10 minutes.  MH significant for chronic coughing and residual pleurisy." ? ?Assessment and Plan: ?Aleece is a 46 y.o. female with: ?Seasonal and perennial allergic rhinoconjunctivitis ?Past history - Perennial rhinoconjunctivitis symptoms with worsening the fall and winter - coughing is most bothersome.  2022 skin testing showed: Positive to grass, ragweed, weed, trees, dust mites, cat, dog, mouse. Borderline to mold. Started AIT on 02/16/2021 (G-W-RW-T, DM-C-D) ?Interim history - localized reaction to pollen vial. ?Continue environmental control  measures. ?Use over the counter antihistamines such as Zyrtec (cetirizine), Claritin (loratadine), Allegra (fexofenadine), or Xyzal (levocetirizine) daily as needed. May take twice a day during allergy flares. May switch antihistamines every few months. ?Continue Singulair (montelukast) 10mg  daily at night. ?Start Ryaltris (olopatadine + mometasone nasal spray combination) 1-2 sprays per nostril twice a day. Sample given. ?Prescription sent in. If it doesn't work that well then okay to continue with Flonase and azelastine as before.  ?Continue allergy injections. ?Nasal saline spray (i.e., Simply Saline) or nasal saline lavage (i.e., NeilMed) is recommended as needed and prior to medicated nasal sprays. ? ?Cough variant asthma ?Past history - Patient noted coughing with posttussive emesis at times for the last few years.  Using albuterol nebulizer less than once a week with good benefit.  History of pleurisy.  No prior COVID-19 infection.  Finished treatment for latent TB.  Normal chest x-ray in 2021.  Takes famotidine for reflux. 2022 spirometry was normal with 4% improvement in FEV1 post bronchodilator treatment.  Clinically feeling improved. ?Interim history - started Airduo and has less coughing this spring. ?Today's spirometry was normal - not as good as previous one. ?Starting in the spring and fall.  ?Start Airduo [birth year]1990 1 puff once a day and rinse mouth after each use. May take 1 puff twice a day if needed. ?May use albuterol rescue inhaler 2 puffs or nebulizer every 4 to 6 hours as needed for shortness of breath, chest tightness, coughing, and wheezing. May use albuterol rescue inhaler 2 puffs 5 to 15 minutes prior to strenuous physical activities. Monitor frequency of use.  ?Get spirometry at next visit. ? ?Gastroesophageal reflux disease ?Stable.  ?Continue  lifestyle and dietary modifications. ?Continue famotine 20mg  twice a day. ? ?Return in about 6 months (around 03/03/2022). ? ?Meds ordered this  encounter  ?Medications  ? Olopatadine-Mometasone (RYALTRIS) [id number] MCG/ACT SUSP  ?  Sig: Place 1-2 sprays into the nose in the morning and at bedtime.  ?  Dispense:  29 g  ?  Refill:  5  ?  [phone]  ? Fluticasone-Salmeterol,sensor, (AIRDUO AvonDIGIHALER) 113-14 MCG/ACT AEPB  ?  Sig: Take 1 puff 1-2 times per day. Rinse mouth after each use.  ?  Dispense:  1 each  ?  Refill:  5  ?  [phone]  ? ?Lab Orders  ?No laboratory test(s) ordered today  ? ? ?Diagnostics: ?Spirometry:  ?Tracings reviewed. Her effort: Good reproducible efforts. ?FVC: 3.61L ?FEV1: 3.25L, 86% predicted ?FEV1/FVC ratio: 90% ?Interpretation: Spirometry consistent with normal pattern.  ?Please see scanned spirometry results for details. ? ? ?Medication List:  ?Current Outpatient Medications  ?Medication Sig Dispense Refill  ? azelastine (ASTELIN) 0.1 % nasal spray Place 1-2 sprays into both nostrils 2 (two) times daily as needed (nasal drainage). Use in each nostril as directed 30 mL 5  ? escitalopram (LEXAPRO) 10 MG tablet Take 10 mg by mouth daily.    ? Fluticasone-Salmeterol,sensor, (AIRDUO DIGIHALER) 113-14 MCG/ACT AEPB Take 1 puff 1-2 times per day. Rinse mouth after each use. 1 each 5  ? JUNEL FE 1/20 1-20 MG-MCG tablet Take by mouth.    ? levocetirizine (XYZAL) 5 MG tablet     ? montelukast (SINGULAIR) 10 MG tablet Take 1 tablet (10 mg total) by mouth at bedtime. 30 tablet 5  ? Multiple Vitamin (MULTI-VITAMIN) tablet Take 1 tablet by mouth daily.    ? Olopatadine-Mometasone (RYALTRIS) [id number] MCG/ACT SUSP Place 1-2 sprays into the nose in the morning and at bedtime. 29 g 5  ? albuterol (VENTOLIN HFA) 108 (90 Base) MCG/ACT inhaler Inhale 2 puffs into the lungs every 4 (four) hours as needed for wheezing or shortness of breath (coughing fits). (Patient not taking: Reported on 08/31/2021) 18 g 1  ? clindamycin (CLINDAGEL) 1 % gel Apply 1 application topically 2 (two) times daily. (Patient not taking: Reported on 08/31/2021)    ? clobetasol  ointment (TEMOVATE) 0.05 % Apply topically 2 (two) times daily. (Patient not taking: Reported on 08/31/2021)    ? cyclobenzaprine (FLEXERIL) 10 MG tablet 1 TABLET THREE TIMES A DAY AS NEEDED ORALLY 30 DAY(S) (Patient not taking: Reported on 08/31/2021)    ? diphenhydrAMINE (BENADRYL) 25 MG tablet  (Patient not taking: Reported on 08/31/2021)    ? famotidine (PEPCID) 20 MG tablet  (Patient not taking: Reported on 08/31/2021)    ? Misc Natural Products (COSAMIN ASU FOR JOINT HEALTH) CAPS  (Patient not taking: Reported on 08/31/2021)    ? ?No current facility-administered medications for this visit.  ? ?Allergies: ?No Known Allergies ?I reviewed her past medical history, social history, family history, and environmental history and no significant changes have been reported from her previous visit. ? ?Review of Systems  ?Constitutional:  Negative for appetite change, chills, fever and unexpected weight change.  ?HENT:  Positive for postnasal drip. Negative for congestion and rhinorrhea.   ?Eyes:  Negative for itching.  ?Respiratory:  Positive for cough. Negative for chest tightness, shortness of breath and wheezing.   ?Cardiovascular:  Negative for chest pain.  ?Gastrointestinal:  Negative for abdominal pain.  ?Genitourinary:  Negative for difficulty urinating.  ?Skin:  Negative for rash.  ?Allergic/Immunologic: Positive for environmental allergies.  ?  Neurological:  Negative for headaches.  ? ?Objective: ?BP 110/68   Pulse 96   Temp 98 ?F (36.7 ?C) (Temporal)   Resp 16   SpO2 98%  ?There is no height or weight on file to calculate BMI. ?Physical Exam ?Vitals and nursing note reviewed.  ?Constitutional:   ?   Appearance: Normal appearance. She is well-developed.  ?HENT:  ?   Head: Normocephalic and atraumatic.  ?   Right Ear: Tympanic membrane and external ear normal.  ?   Left Ear: Tympanic membrane and external ear normal.  ?   Nose: Nose normal.  ?   Mouth/Throat:  ?   Mouth: Mucous membranes are moist.  ?   Pharynx:  Oropharynx is clear.  ?Eyes:  ?   Conjunctiva/sclera: Conjunctivae normal.  ?Cardiovascular:  ?   Rate and Rhythm: Normal rate and regular rhythm.  ?   Heart sounds: Normal heart sounds. No murmur heard. ?  No friction rub. No g

## 2021-08-31 NOTE — Patient Instructions (Addendum)
Environmental allergies ?2022 skin testing showed: Positive to grass, ragweed, weed, trees, dust mites, cat, dog, mouse. Borderline to mold.  ?Continue environmental control measures. ?Use over the counter antihistamines such as Zyrtec (cetirizine), Claritin (loratadine), Allegra (fexofenadine), or Xyzal (levocetirizine) daily as needed. May take twice a day during allergy flares. May switch antihistamines every few months. ?Continue Singulair (montelukast) 10mg  daily at night. ?Start Ryaltris (olopatadine + mometasone nasal spray combination) 1-2 sprays per nostril twice a day. Sample given. ?Prescription sent in. If it doesn't work that well then okay to continue with Flonase and azelastine as before.  ?Continue allergy injections. ?Nasal saline spray (i.e., Simply Saline) or nasal saline lavage (i.e., NeilMed) is recommended as needed and prior to medicated nasal sprays. ? ?Coughing: ?Starting in the spring and fall.  ?Start Airduo 140mcg 1 puff once a day and rinse mouth after each use. May take 1 puff twice a day if needed. ?This will be mailed to you from [facility].  ?May use albuterol rescue inhaler 2 puffs or nebulizer every 4 to 6 hours as needed for shortness of breath, chest tightness, coughing, and wheezing. May use albuterol rescue inhaler 2 puffs 5 to 15 minutes prior to strenuous physical activities. Monitor frequency of use.  ? ?Heartburn: ?Continue lifestyle and dietary modifications. ?Continue famotine 20mg  twice a day. ? ?Follow up in 6 months or sooner if needed.  ?

## 2021-09-02 ENCOUNTER — Ambulatory Visit (INDEPENDENT_AMBULATORY_CARE_PROVIDER_SITE_OTHER): Payer: 59

## 2021-09-02 DIAGNOSIS — J309 Allergic rhinitis, unspecified: Secondary | ICD-10-CM

## 2021-09-07 ENCOUNTER — Encounter: Payer: Self-pay | Admitting: Emergency Medicine

## 2021-09-07 ENCOUNTER — Emergency Department (INDEPENDENT_AMBULATORY_CARE_PROVIDER_SITE_OTHER)
Admission: EM | Admit: 2021-09-07 | Discharge: 2021-09-07 | Disposition: A | Discharge status: Home / Self Care | Payer: 59 | Source: Home / Self Care

## 2021-09-07 ENCOUNTER — Ambulatory Visit (INDEPENDENT_AMBULATORY_CARE_PROVIDER_SITE_OTHER): Payer: 59

## 2021-09-07 DIAGNOSIS — L509 Urticaria, unspecified: Secondary | ICD-10-CM

## 2021-09-07 DIAGNOSIS — T7840XA Allergy, unspecified, initial encounter: Secondary | ICD-10-CM | POA: Diagnosis not present

## 2021-09-07 DIAGNOSIS — J309 Allergic rhinitis, unspecified: Secondary | ICD-10-CM

## 2021-09-07 MED ORDER — METHYLPREDNISOLONE SODIUM SUCC 125 MG IJ SOLR
125.0000 mg | Freq: Once | INTRAMUSCULAR | Status: AC
  Administered 2021-09-07: 125 mg via INTRAMUSCULAR

## 2021-09-07 MED ORDER — METHYLPREDNISOLONE 4 MG PO TBPK: ORAL_TABLET | ORAL | 0 refills | Status: DC

## 2021-09-07 MED ORDER — FEXOFENADINE HCL 180 MG PO TABS: 180.0000 mg | ORAL_TABLET | Freq: Every day | ORAL | 0 refills | Status: DC

## 2021-09-07 NOTE — Discharge Instructions (Addendum)
Advised patient to take medication as directed with food to completion.  Instructed patient to start Medrol Dosepak tomorrow morning Wednesday, 09/08/2021.  Advised patient to take Allegra with Medrol Dosepak for the next 5 days.  Advised may use Allegra as needed afterwards for residual/remaining urticaria.  Encouraged patient to increase daily water intake while taking these medications. ?

## 2021-09-07 NOTE — ED Provider Notes (Signed)
?[facility] ? ? ? ?CSN: [id number] ?Arrival date & time: 09/07/21  1915 ? ? ?  ? ?History   ?Chief Complaint ?Chief Complaint  ?Patient presents with  ? Allergic Reaction  ? ? ?HPI ?Brooke Navarro is a 46 y.o. female.  ? ?HPI 47 year old female presents with allergic reaction today.  Reports rash and hives everywhere no new meds other than allergy shot today.  PMH significant for GERD and cough variant asthma.  Patient evaluated by me for similar symptoms including urticaria on 08/24/2021. ? ?Past Medical History:  ?Diagnosis Date  ? Allergic rhinitis   ? Arthritis   ? ? ?Patient Active Problem List  ? Diagnosis Date Noted  ? Cough variant asthma 08/31/2021  ? Gastroesophageal reflux disease 08/31/2021  ? Seasonal and perennial allergic rhinoconjunctivitis 03/04/2021  ? Pleurisy, residual 09/09/2011  ? ? ?Past Surgical History:  ?Procedure Laterality Date  ? KNEE SURGERY  11/2009  ? left  ? ? ?OB History   ?No obstetric history on file. ?  ? ? ? ?Home Medications   ? ?Prior to Admission medications   ?Medication Sig Start Date End Date Taking? Authorizing Provider  ?fexofenadine (ALLEGRA ALLERGY) 180 MG tablet Take 1 tablet (180 mg total) by mouth daily for 15 days. 09/07/21 09/22/21 Yes Trevor Iha, FNP  ?methylPREDNISolone (MEDROL DOSEPAK) 4 MG TBPK tablet Take as directed 09/07/21  Yes Trevor Iha, FNP  ?albuterol (VENTOLIN HFA) 108 (90 Base) MCG/ACT inhaler Inhale 2 puffs into the lungs every 4 (four) hours as needed for wheezing or shortness of breath (coughing fits). ?Patient not taking: Reported on 08/31/2021 07/06/21   Ellamae Sia, DO  ?azelastine (ASTELIN) 0.1 % nasal spray Place 1-2 sprays into both nostrils 2 (two) times daily as needed (nasal drainage). Use in each nostril as directed 07/06/21   Ellamae Sia, DO  ?clindamycin (CLINDAGEL) 1 % gel Apply 1 application topically 2 (two) times daily. ?Patient not taking: Reported on 08/31/2021 12/21/20   [provider]  ?clobetasol ointment  (TEMOVATE) 0.05 % Apply topically 2 (two) times daily. ?Patient not taking: Reported on 08/31/2021 12/17/20   [provider]  ?cyclobenzaprine (FLEXERIL) 10 MG tablet 1 TABLET THREE TIMES A DAY AS NEEDED ORALLY 30 DAY(S) ?Patient not taking: Reported on 08/31/2021 08/26/15   [provider]  ?diphenhydrAMINE (BENADRYL) 25 MG tablet     [provider]  ?escitalopram (LEXAPRO) 10 MG tablet Take 10 mg by mouth daily. 09/22/20   [provider]  ?famotidine (PEPCID) 20 MG tablet     [provider]  ?Fluticasone-Salmeterol,sensor, (AIRDUO DIGIHALER) 113-14 MCG/ACT AEPB Take 1 puff 1-2 times per day. Rinse mouth after each use. 08/31/21   Ellamae Sia, DO  ?JUNEL FE 1/20 1-20 MG-MCG tablet Take by mouth. 12/03/20   [provider]  ?levocetirizine (XYZAL) 5 MG tablet     [provider]  ?Misc Natural Products (COSAMIN ASU FOR JOINT HEALTH) CAPS     [provider]  ?montelukast (SINGULAIR) 10 MG tablet Take 1 tablet (10 mg total) by mouth at bedtime. 07/06/21   Ellamae Sia, DO  ?Multiple Vitamin (MULTI-VITAMIN) tablet Take 1 tablet by mouth daily.    [provider]  ?Olopatadine-Mometasone [facility]) [phone] MCG/ACT SUSP Place 1-2 sprays into the nose in the morning and at bedtime. 08/31/21   Ellamae Sia, DO  ? ? ?Family History ?Family History  ?Problem Relation Age of Onset  ? Allergic rhinitis Mother   ?  Allergic rhinitis Father   ? Urticaria Father   ? Allergic rhinitis Sister   ? Urticaria Sister   ? Asthma Sister   ? Allergic rhinitis Brother   ? Breast cancer Maternal Grandmother   ? Melanoma Maternal Grandfather   ? Heart disease Paternal Grandmother   ? Lung cancer Paternal Grandfather   ? ? ?Social History ?Social History  ? ?Tobacco Use  ? Smoking status: Never  ?  Passive exposure: Current  ? Smokeless tobacco: Never  ?Vaping Use  ? Vaping Use: Never used  ?Substance Use Topics  ? Alcohol use: Yes  ? Drug use: Never  ? ? ? ?Allergies    ?Patient has no known allergies. ? ? ?Review of Systems ?Review of Systems  ?Skin:  Positive for rash.  ? ? ?Physical Exam ?Triage Vital Signs ?ED Triage Vitals  ?Enc Vitals Group  ?   BP 09/07/21 1937 137/82  ?   Pulse Rate 09/07/21 1937 94  ?   Resp 09/07/21 1937 16  ?   Temp 09/07/21 1937 99.6 ?F (37.6 ?C)  ?   Temp Source 09/07/21 1937 Oral  ?   SpO2 09/07/21 1937 99 %  ?   Weight 09/07/21 1940 220 lb 10.9 oz (100.1 kg)  ?   Height --   ?   Head Circumference --   ?   Peak Flow --   ?   Pain Score 09/07/21 1940 0  ?   Pain Loc --   ?   Pain Edu? --   ?   Excl. in GC? --   ? ?No data found. ? ?Updated Vital Signs ?BP 137/82 (BP Location: Right Arm)   Pulse 94   Temp 99.6 ?F (37.6 ?C) (Oral)   Resp 16   Wt 220 lb 10.9 oz (100.1 kg)   SpO2 99%   BMI 33.07 kg/m?  ? ?  ? ?Physical Exam ?Vitals and nursing note reviewed.  ?Constitutional:   ?   General: She is not in acute distress. ?   Appearance: Normal appearance. She is obese. She is not ill-appearing.  ?HENT:  ?   Head: Normocephalic and atraumatic.  ?   Mouth/Throat:  ?   Mouth: Mucous membranes are moist.  ?   Pharynx: Oropharynx is clear.  ?Eyes:  ?   Extraocular Movements: Extraocular movements intact.  ?   Conjunctiva/sclera: Conjunctivae normal.  ?   Pupils: Pupils are equal, round, and reactive to light.  ?Cardiovascular:  ?   Rate and Rhythm: Normal rate and regular rhythm.  ?   Pulses: Normal pulses.  ?   Heart sounds: Normal heart sounds.  ?Pulmonary:  ?   Effort: Pulmonary effort is normal.  ?   Breath sounds: Normal breath sounds. No wheezing, rhonchi or rales.  ?Musculoskeletal:  ?   Cervical back: Normal range of motion and neck supple.  ?Skin: ?   General: Skin is warm and dry.  ?   Comments: Anterior neck, chest, abdomen, torso: Erythematous superficial widespread and confluent plaques with clear dusky centers, borders are annular, pruritic per patient  ?Neurological:  ?   General: No focal deficit present.  ?   Mental Status: She is alert  and oriented to person, place, and time.  ? ? ? ?UC Treatments / Results  ?Labs ?(all labs ordered are listed, but only abnormal results are displayed) ?Labs Reviewed - No data to display ? ?EKG ? ? ?Radiology ?No results found. ? ?Procedures ?Procedures (including  critical care time) ? ?Medications Ordered in UC ?Medications  ?methylPREDNISolone sodium succinate (SOLU-MEDROL) 125 mg/2 mL injection 125 mg (125 mg Intramuscular Given 09/07/21 2020)  ? ? ?Initial Impression / Assessment and Plan / UC Course  ?I have reviewed the triage vital signs and the nursing notes. ? ?Pertinent labs & imaging results that were available during my care of the patient were reviewed by me and considered in my medical decision making (see chart for details). ? ?  ? ?MDM: 1.  Allergic reaction, initial encounter-IM Solu-Medrol 125 mg given once in clinic prior to discharge; 2.  Urticaria-Rx'd Allegra and Medrol Dosepak. Advised patient to take medication as directed with food to completion.  Instructed patient to start Medrol Dosepak tomorrow morning Wednesday, 09/08/2021.  Advised patient to take Allegra with Medrol Dosepak for the next 5 days.  Advised may use Allegra as needed afterwards for residual/remaining urticaria.  Encouraged patient to increase daily water intake while taking these medications.  Patient discharged home, hemodynamically stable. ?Final Clinical Impressions(s) / UC Diagnoses  ? ?Final diagnoses:  ?Urticaria  ?Allergic reaction, initial encounter  ? ? ? ?Discharge Instructions   ? ?  ?Advised patient to take medication as directed with food to completion.  Instructed patient to start Medrol Dosepak tomorrow morning Wednesday, 09/08/2021.  Advised patient to take Allegra with Medrol Dosepak for the next 5 days.  Advised may use Allegra as needed afterwards for residual/remaining urticaria.  Encouraged patient to increase daily water intake while taking these medications. ? ? ? ? ?ED Prescriptions   ? ? Medication Sig  Dispense Auth. Provider  ? methylPREDNISolone (MEDROL DOSEPAK) 4 MG TBPK tablet Take as directed 1 each Trevor Ihaagan, Elisabet Gutzmer, FNP  ? fexofenadine (ALLEGRA ALLERGY) 180 MG tablet Take 1 tablet (180 mg total) by mouth dai

## 2021-09-07 NOTE — ED Triage Notes (Addendum)
Allergic reaction today at 1730 today  ?Benadryl at 1830 (50mg  PO)  ?Presents with rash & hives everywhere ?No new meds other than allergy shot today  ?

## 2021-09-08 ENCOUNTER — Telehealth: Payer: Self-pay | Admitting: Allergy

## 2021-09-08 NOTE — Telephone Encounter (Signed)
Patient's allergy flow sheet has been updated to reflect these changes. Called patient and advised, patient verbalized understanding.  

## 2021-09-08 NOTE — Telephone Encounter (Signed)
Please call patient. ? ?We are going to hold her allergy shot dose on the green vial at [facility] for a few months as she had 2 reactions with the red vial. ? ?Please adjust her flow sheet - HOLD at [facility] every 1-2 weeks. ? ?Follow up with me in July and will discuss at that time increasing the dose if doing well. ? ?Thank you.  ? ?

## 2021-09-08 NOTE — Telephone Encounter (Signed)
Brooke Navarro called in and states that she had an allergic reaction and she believes it was from her allergy injection because it happened about an hour after she received her injection.  [name] states she broke out in hives on her face, neck, chest, arms, and groin.  [name] states she went to urgent care and they gave her an injection and prednisone pack and it took care of the hives.  [name] stated this allergic reaction was worse than the last one she had.  [name] would like some advice on what do do next time.  Please advise.  ?

## 2021-09-14 ENCOUNTER — Ambulatory Visit (INDEPENDENT_AMBULATORY_CARE_PROVIDER_SITE_OTHER): Payer: 59

## 2021-09-14 DIAGNOSIS — J309 Allergic rhinitis, unspecified: Secondary | ICD-10-CM

## 2021-09-23 ENCOUNTER — Ambulatory Visit (INDEPENDENT_AMBULATORY_CARE_PROVIDER_SITE_OTHER): Payer: 59

## 2021-09-23 DIAGNOSIS — J309 Allergic rhinitis, unspecified: Secondary | ICD-10-CM

## 2021-10-01 ENCOUNTER — Ambulatory Visit (INDEPENDENT_AMBULATORY_CARE_PROVIDER_SITE_OTHER): Payer: 59 | Admitting: *Deleted

## 2021-10-01 DIAGNOSIS — J309 Allergic rhinitis, unspecified: Secondary | ICD-10-CM | POA: Diagnosis not present

## 2021-10-06 ENCOUNTER — Ambulatory Visit (INDEPENDENT_AMBULATORY_CARE_PROVIDER_SITE_OTHER): Payer: 59

## 2021-10-06 DIAGNOSIS — J309 Allergic rhinitis, unspecified: Secondary | ICD-10-CM

## 2021-10-14 ENCOUNTER — Ambulatory Visit (INDEPENDENT_AMBULATORY_CARE_PROVIDER_SITE_OTHER): Payer: 59

## 2021-10-14 DIAGNOSIS — J309 Allergic rhinitis, unspecified: Secondary | ICD-10-CM

## 2021-10-28 ENCOUNTER — Ambulatory Visit (INDEPENDENT_AMBULATORY_CARE_PROVIDER_SITE_OTHER): Payer: 59

## 2021-10-28 DIAGNOSIS — J309 Allergic rhinitis, unspecified: Secondary | ICD-10-CM | POA: Diagnosis not present

## 2021-11-05 ENCOUNTER — Ambulatory Visit (INDEPENDENT_AMBULATORY_CARE_PROVIDER_SITE_OTHER): Payer: 59 | Admitting: *Deleted

## 2021-11-05 DIAGNOSIS — J309 Allergic rhinitis, unspecified: Secondary | ICD-10-CM

## 2021-11-11 ENCOUNTER — Ambulatory Visit (INDEPENDENT_AMBULATORY_CARE_PROVIDER_SITE_OTHER): Payer: 59

## 2021-11-11 DIAGNOSIS — J309 Allergic rhinitis, unspecified: Secondary | ICD-10-CM | POA: Diagnosis not present

## 2021-11-16 ENCOUNTER — Ambulatory Visit (INDEPENDENT_AMBULATORY_CARE_PROVIDER_SITE_OTHER): Payer: 59

## 2021-11-16 DIAGNOSIS — J309 Allergic rhinitis, unspecified: Secondary | ICD-10-CM

## 2021-11-25 ENCOUNTER — Ambulatory Visit (INDEPENDENT_AMBULATORY_CARE_PROVIDER_SITE_OTHER): Payer: 59

## 2021-11-25 DIAGNOSIS — J309 Allergic rhinitis, unspecified: Secondary | ICD-10-CM | POA: Diagnosis not present

## 2021-11-30 ENCOUNTER — Ambulatory Visit (INDEPENDENT_AMBULATORY_CARE_PROVIDER_SITE_OTHER): Payer: 59

## 2021-11-30 ENCOUNTER — Encounter: Payer: Self-pay | Admitting: Allergy

## 2021-11-30 DIAGNOSIS — J309 Allergic rhinitis, unspecified: Secondary | ICD-10-CM

## 2021-12-01 NOTE — Telephone Encounter (Signed)
Patient will continue to build up to green 0.5 using schedule A. Then will move to red vial with schedule A.  If we need to repeat doses, then I will let you know.  Thank you.

## 2021-12-14 ENCOUNTER — Ambulatory Visit (INDEPENDENT_AMBULATORY_CARE_PROVIDER_SITE_OTHER): Payer: 59

## 2021-12-14 DIAGNOSIS — J309 Allergic rhinitis, unspecified: Secondary | ICD-10-CM | POA: Diagnosis not present

## 2021-12-23 ENCOUNTER — Ambulatory Visit (INDEPENDENT_AMBULATORY_CARE_PROVIDER_SITE_OTHER): Payer: 59 | Admitting: *Deleted

## 2021-12-23 DIAGNOSIS — J309 Allergic rhinitis, unspecified: Secondary | ICD-10-CM

## 2021-12-24 DIAGNOSIS — G43009 Migraine without aura, not intractable, without status migrainosus: Secondary | ICD-10-CM | POA: Insufficient documentation

## 2021-12-24 DIAGNOSIS — Z227 Latent tuberculosis: Secondary | ICD-10-CM | POA: Insufficient documentation

## 2021-12-24 DIAGNOSIS — K219 Gastro-esophageal reflux disease without esophagitis: Secondary | ICD-10-CM | POA: Insufficient documentation

## 2021-12-24 DIAGNOSIS — N946 Dysmenorrhea, unspecified: Secondary | ICD-10-CM | POA: Insufficient documentation

## 2021-12-24 DIAGNOSIS — J309 Allergic rhinitis, unspecified: Secondary | ICD-10-CM | POA: Insufficient documentation

## 2021-12-26 NOTE — Progress Notes (Signed)
Follow Up Note  RE: Brooke Navarro MRN: [id number] DOB: 1976-04-18 Date of Office Visit: 12/27/2021  Referring provider: Tally Joe, MD Primary care provider: Tally Joe, MD  Chief Complaint: Follow-up and Cough (Last 2 months. GI is treating her for reflux.)  History of Present Illness: I had the pleasure of seeing Brooke Navarro for a follow up visit at the [facility] on 12/27/2021. She is a 46 y.o. female, who is being followed for allergic rhinoconjunctivitis on AIT, cough variant asthma and GERD. Her previous allergy office visit was on 08/31/2021 with Dr. Selena Batten. Today is a regular follow up visit.  Seasonal and perennial allergic rhinoconjunctivitis Minimal localized reactions with the injections and would like to move forward with the red vial.  Currently taking Xyzal and Singulair daily. No recent nasal spray use but the Ryaltris did help.  Using less benadryl now since on AIT.   Cough variant asthma Dry cough since May and some voice hoarseness. Stopped Airduo as it made it worse. Used albuterol at times with some benefit but sometimes with no benefit.   Saw GI and being treated with Protonix 40mg  BID which seems to improve the coughing. GI may want to do EGD if no improvement.   Assessment and Plan: Brooke Navarro is a 46 y.o. female with: Seasonal and perennial allergic rhinoconjunctivitis Past history - Perennial rhinoconjunctivitis symptoms with worsening the fall and winter - coughing is most bothersome.  2022 skin testing showed: Positive to grass, ragweed, weed, trees, dust mites, cat, dog, mouse. Borderline to mold. Started AIT on 02/16/2021 (G-W-RW-T, DM-C-D) Interim history - localized reactions improved. Continue environmental control measures. Use over the counter antihistamines such as Zyrtec (cetirizine), Claritin (loratadine), Allegra (fexofenadine), or Xyzal (levocetirizine) daily as needed. May take twice a day during allergy flares. May  switch antihistamines every few months. Continue Singulair (montelukast) 10mg  daily at night. May use Ryaltris (olopatadine + mometasone nasal spray combination) 1-2 sprays per nostril twice a day.   Continue allergy injections - will move forward with red vial using schedule A. Given today.  May take an additional allegra in the morning of allergy injections.  Nasal saline spray (i.e., Simply Saline) or nasal saline lavage (i.e., NeilMed) is recommended as needed and prior to medicated nasal sprays.  Cough variant asthma Past history - Patient noted coughing with posttussive emesis at times for the last few years.  Using albuterol nebulizer less than once a week with good benefit.  History of pleurisy.  No prior COVID-19 infection.  Finished treatment for latent TB.  Normal chest x-ray in 2021.  Takes famotidine for reflux. 2022 spirometry was normal with 4% improvement in FEV1 post bronchodilator treatment.  Clinically feeling improved. Interim history - started Airduo which worsened cough in May, not sure if this is due from GERD or not. Denies any fevers, weight loss.  Will start on daily inhaler to see if coughing improves. If no improvement get CXR as well.  Today's spirometry showed some restriction. Daily controller medication(s): START Symbicort 2023 2 puffs twice a day with spacer and rinse mouth afterwards. If not covered let me know.  Spacer given and demonstrated proper use with inhaler. Patient understood technique and all questions/concerned were addressed.  May use albuterol rescue inhaler 2 puffs every 4 to 6 hours as needed for shortness of breath, chest tightness, coughing, and wheezing. May use albuterol rescue inhaler 2 puffs 5 to 15 minutes prior to strenuous physical activities. Monitor frequency of use.  Get spirometry at next visit.  Gastroesophageal reflux disease Not controlled. Followed by GI. Continue lifestyle and dietary modifications. Continue Protonix 40mg   twice a day. Follow up with GI as scheduled.  Return in about 4 months (around 04/29/2022).  Meds ordered this encounter  Medications   budesonide-formoterol (SYMBICORT) 80-4.5 MCG/ACT inhaler    Sig: Inhale 2 puffs into the lungs in the morning and at bedtime. with spacer and rinse mouth afterwards.    Dispense:  1 each    Refill:  3   Lab Orders  No laboratory test(s) ordered today    Diagnostics: Spirometry:  Tracings reviewed. Her effort: Good reproducible efforts. FVC: 3.13L FEV1: 2.80L, 82% predicted FEV1/FVC ratio: 89% Interpretation: Spirometry consistent with possible restrictive disease.  Please see scanned spirometry results for details.  Medication List:  Current Outpatient Medications  Medication Sig Dispense Refill   albuterol (VENTOLIN HFA) 108 (90 Base) MCG/ACT inhaler Inhale 2 puffs into the lungs every 4 (four) hours as needed for wheezing or shortness of breath (coughing fits). 18 g 1   budesonide-formoterol (SYMBICORT) 80-4.5 MCG/ACT inhaler Inhale 2 puffs into the lungs in the morning and at bedtime. with spacer and rinse mouth afterwards. 1 each 3   clindamycin (CLINDAGEL) 1 % gel Apply 1 application  topically 2 (two) times daily.     clobetasol ointment (TEMOVATE) 0.05 % Apply topically 2 (two) times daily.     cyclobenzaprine (FLEXERIL) 10 MG tablet      diphenhydrAMINE (BENADRYL) 25 MG tablet      escitalopram (LEXAPRO) 10 MG tablet Take 10 mg by mouth daily.     JUNEL FE 1/20 1-20 MG-MCG tablet Take by mouth.     levocetirizine (XYZAL) 5 MG tablet      Misc Natural Products (COSAMIN ASU FOR JOINT HEALTH) CAPS      montelukast (SINGULAIR) 10 MG tablet Take 1 tablet (10 mg total) by mouth at bedtime. 30 tablet 5   Multiple Vitamin (MULTI-VITAMIN) tablet Take 1 tablet by mouth daily.     Olopatadine-Mometasone (RYALTRIS) 2/20 MCG/ACT SUSP Place 1-2 sprays into the nose in the morning and at bedtime. 29 g 5   pantoprazole (PROTONIX) 40 MG tablet Take  40 mg by mouth daily.     fexofenadine (ALLEGRA ALLERGY) 180 MG tablet Take 1 tablet (180 mg total) by mouth daily for 15 days. 15 tablet 0   No current facility-administered medications for this visit.   Allergies: No Known Allergies I reviewed her past medical history, social history, family history, and environmental history and no significant changes have been reported from her previous visit.  Review of Systems  Constitutional:  Negative for appetite change, chills, fever and unexpected weight change.  HENT:  Negative for congestion and rhinorrhea.   Eyes:  Negative for itching.  Respiratory:  Positive for cough. Negative for chest tightness, shortness of breath and wheezing.   Cardiovascular:  Negative for chest pain.  Gastrointestinal:  Negative for abdominal pain.  Genitourinary:  Negative for difficulty urinating.  Skin:  Negative for rash.  Allergic/Immunologic: Positive for environmental allergies.  Neurological:  Negative for headaches.    Objective: BP 128/78   Pulse 74   Temp 98 F (36.7 C) (Temporal)   Resp 16   Wt 224 lb (101.6 kg)   SpO2 99%   BMI 33.56 kg/m  Body mass index is 33.56 kg/m. Physical Exam Vitals and nursing note reviewed.  Constitutional:      Appearance: Normal appearance. She is well-developed.  HENT:     Head: Normocephalic and atraumatic.     Right Ear: Tympanic membrane and external ear normal.     Left Ear: Tympanic membrane and external ear normal.     Nose: Nose normal.     Mouth/Throat:     Mouth: Mucous membranes are moist.     Pharynx: Oropharynx is clear.  Eyes:     Conjunctiva/sclera: Conjunctivae normal.  Cardiovascular:     Rate and Rhythm: Normal rate and regular rhythm.     Heart sounds: Normal heart sounds. No murmur heard.    No friction rub. No gallop.  Pulmonary:     Effort: Pulmonary effort is normal.     Breath sounds: Normal breath sounds. No wheezing, rhonchi or rales.  Abdominal:     Palpations: Abdomen  is soft.  Musculoskeletal:     Cervical back: Neck supple.  Skin:    General: Skin is warm.     Findings: No rash.  Neurological:     Mental Status: She is alert and oriented to person, place, and time.  Psychiatric:        Behavior: Behavior normal.    Previous notes and tests were reviewed. The plan was reviewed with the patient/family, and all questions/concerned were addressed.  It was my pleasure to see Brooke Navarro today and participate in her care. Please feel free to contact me with any questions or concerns.  Sincerely,  Wyline Mood, DO Allergy & Immunology  [facility] office: [phone] [facility] office: [phone]

## 2021-12-27 ENCOUNTER — Ambulatory Visit: Payer: Self-pay

## 2021-12-27 ENCOUNTER — Encounter: Payer: Self-pay | Admitting: Allergy

## 2021-12-27 ENCOUNTER — Ambulatory Visit: Payer: 59 | Admitting: Allergy

## 2021-12-27 VITALS — BP 128/78 | HR 74 | Temp 98.0°F | Resp 16 | Wt 224.0 lb

## 2021-12-27 DIAGNOSIS — H1013 Acute atopic conjunctivitis, bilateral: Secondary | ICD-10-CM

## 2021-12-27 DIAGNOSIS — J309 Allergic rhinitis, unspecified: Secondary | ICD-10-CM

## 2021-12-27 DIAGNOSIS — J4520 Mild intermittent asthma, uncomplicated: Secondary | ICD-10-CM

## 2021-12-27 DIAGNOSIS — K219 Gastro-esophageal reflux disease without esophagitis: Secondary | ICD-10-CM | POA: Diagnosis not present

## 2021-12-27 DIAGNOSIS — H1010 Acute atopic conjunctivitis, unspecified eye: Secondary | ICD-10-CM

## 2021-12-27 DIAGNOSIS — J45991 Cough variant asthma: Secondary | ICD-10-CM

## 2021-12-27 MED ORDER — BUDESONIDE-FORMOTEROL FUMARATE 80-4.5 MCG/ACT IN AERO: 2.0000 | INHALATION_SPRAY | Freq: Two times a day (BID) | RESPIRATORY_TRACT | 3 refills | Status: DC

## 2021-12-27 NOTE — Patient Instructions (Addendum)
Environmental allergies 2022 skin testing showed: Positive to grass, ragweed, weed, trees, dust mites, cat, dog, mouse. Borderline to mold.  Continue environmental control measures. Use over the counter antihistamines such as Zyrtec (cetirizine), Claritin (loratadine), Allegra (fexofenadine), or Xyzal (levocetirizine) daily as needed. May take twice a day during allergy flares. May switch antihistamines every few months. Continue Singulair (montelukast) 10mg  daily at night. May use Ryaltris (olopatadine + mometasone nasal spray combination) 1-2 sprays per nostril twice a day.   Continue allergy injections - will move forward with red vial.  Given today.  May take an additional allegra in the morning of allergy injections.  Nasal saline spray (i.e., Simply Saline) or nasal saline lavage (i.e., NeilMed) is recommended as needed and prior to medicated nasal sprays.  Coughing: Daily controller medication(s): START Symbicort 2 puffs twice a day with spacer and rinse mouth afterwards. If not covered let me know.  Spacer given and demonstrated proper use with inhaler. Patient understood technique and all questions/concerned were addressed.  May use albuterol rescue inhaler 2 puffs every 4 to 6 hours as needed for shortness of breath, chest tightness, coughing, and wheezing. May use albuterol rescue inhaler 2 puffs 5 to 15 minutes prior to strenuous physical activities. Monitor frequency of use.  Asthma control goals:  Full participation in all desired activities (may need albuterol before activity) Albuterol use two times or less a week on average (not counting use with activity) Cough interfering with sleep two times or less a month Oral steroids no more than once a year No hospitalizations   Heartburn: Continue lifestyle and dietary modifications. Continue Protonix 40mg  twice a day.  Follow up in 4 months or sooner if needed.

## 2021-12-27 NOTE — Assessment & Plan Note (Signed)
Not controlled. Followed by GI.  Continue lifestyle and dietary modifications.  Continue Protonix 40mg  twice a day.  Follow up with GI as scheduled.

## 2021-12-27 NOTE — Assessment & Plan Note (Addendum)
Past history - Patient noted coughing with posttussive emesis at times for the last few years.  Using albuterol nebulizer less than once a week with good benefit.  History of pleurisy.  No prior COVID-19 infection.  Finished treatment for latent TB.  Normal chest x-ray in 2021.  Takes famotidine for reflux. 2022 spirometry was normal with 4% improvement in FEV1 post bronchodilator treatment.  Clinically feeling improved. Interim history - started Airduo which worsened cough in May, not sure if this is due from GERD or not. Denies any fevers, weight loss.   Will start on daily inhaler to see if coughing improves.  If no improvement get CXR as well.   Today's spirometry showed some restriction. . Daily controller medication(s): START Symbicort 2 puffs twice a day with spacer and rinse mouth afterwards. o If not covered let me know.  Marland Kitchen Spacer given and demonstrated proper use with inhaler. Patient understood technique and all questions/concerned were addressed.  . May use albuterol rescue inhaler 2 puffs every 4 to 6 hours as needed for shortness of breath, chest tightness, coughing, and wheezing. May use albuterol rescue inhaler 2 puffs 5 to 15 minutes prior to strenuous physical activities. Monitor frequency of use.  . Get spirometry at next visit.

## 2021-12-27 NOTE — Assessment & Plan Note (Signed)
Past history - Perennial rhinoconjunctivitis symptoms with worsening the fall and winter - coughing is most bothersome.  2022 skin testing showed: Positive to grass, ragweed, weed, trees, dust mites, cat, dog, mouse. Borderline to mold. Started AIT on 02/16/2021 (G-W-RW-T, DM-C-D) Interim history - localized reactions improved.  Continue environmental control measures.  Use over the counter antihistamines such as Zyrtec (cetirizine), Claritin (loratadine), Allegra (fexofenadine), or Xyzal (levocetirizine) daily as needed. May take twice a day during allergy flares. May switch antihistamines every few months.  Continue Singulair (montelukast) 10mg  daily at night.  May use Ryaltris (olopatadine + mometasone nasal spray combination) 1-2 sprays per nostril twice a day.    Continue allergy injections - will move forward with red vial using schedule A.  Given today.   May take an additional allegra in the morning of allergy injections.   Nasal saline spray (i.e., Simply Saline) or nasal saline lavage (i.e., NeilMed) is recommended as needed and prior to medicated nasal sprays.

## 2021-12-28 DIAGNOSIS — J3081 Allergic rhinitis due to animal (cat) (dog) hair and dander: Secondary | ICD-10-CM | POA: Diagnosis not present

## 2021-12-28 NOTE — Progress Notes (Signed)
VIALS EXP 12-29-22 

## 2022-01-03 ENCOUNTER — Ambulatory Visit (INDEPENDENT_AMBULATORY_CARE_PROVIDER_SITE_OTHER): Payer: 59

## 2022-01-03 DIAGNOSIS — J309 Allergic rhinitis, unspecified: Secondary | ICD-10-CM

## 2022-01-04 ENCOUNTER — Other Ambulatory Visit: Payer: Self-pay | Admitting: Allergy

## 2022-01-04 DIAGNOSIS — J301 Allergic rhinitis due to pollen: Secondary | ICD-10-CM | POA: Diagnosis not present

## 2022-01-10 ENCOUNTER — Ambulatory Visit (INDEPENDENT_AMBULATORY_CARE_PROVIDER_SITE_OTHER): Payer: 59

## 2022-01-10 DIAGNOSIS — J309 Allergic rhinitis, unspecified: Secondary | ICD-10-CM | POA: Diagnosis not present

## 2022-01-18 ENCOUNTER — Ambulatory Visit (INDEPENDENT_AMBULATORY_CARE_PROVIDER_SITE_OTHER): Payer: 59 | Admitting: *Deleted

## 2022-01-18 DIAGNOSIS — J309 Allergic rhinitis, unspecified: Secondary | ICD-10-CM | POA: Diagnosis not present

## 2022-01-25 ENCOUNTER — Ambulatory Visit (INDEPENDENT_AMBULATORY_CARE_PROVIDER_SITE_OTHER): Payer: 59 | Admitting: *Deleted

## 2022-01-25 DIAGNOSIS — J309 Allergic rhinitis, unspecified: Secondary | ICD-10-CM | POA: Diagnosis not present

## 2022-01-26 ENCOUNTER — Other Ambulatory Visit: Payer: Self-pay | Admitting: Allergy

## 2022-02-01 ENCOUNTER — Ambulatory Visit (INDEPENDENT_AMBULATORY_CARE_PROVIDER_SITE_OTHER): Payer: 59 | Admitting: *Deleted

## 2022-02-01 DIAGNOSIS — J309 Allergic rhinitis, unspecified: Secondary | ICD-10-CM

## 2022-02-17 ENCOUNTER — Ambulatory Visit (INDEPENDENT_AMBULATORY_CARE_PROVIDER_SITE_OTHER): Payer: No Typology Code available for payment source | Admitting: *Deleted

## 2022-02-17 DIAGNOSIS — J309 Allergic rhinitis, unspecified: Secondary | ICD-10-CM | POA: Diagnosis not present

## 2022-02-22 ENCOUNTER — Ambulatory Visit (INDEPENDENT_AMBULATORY_CARE_PROVIDER_SITE_OTHER): Payer: No Typology Code available for payment source

## 2022-02-22 DIAGNOSIS — J309 Allergic rhinitis, unspecified: Secondary | ICD-10-CM

## 2022-02-23 ENCOUNTER — Other Ambulatory Visit: Payer: Self-pay | Admitting: Physician Assistant

## 2022-02-23 DIAGNOSIS — K219 Gastro-esophageal reflux disease without esophagitis: Secondary | ICD-10-CM

## 2022-03-01 ENCOUNTER — Ambulatory Visit
Admission: RE | Admit: 2022-03-01 | Discharge: 2022-03-01 | Disposition: A | Discharge status: Home / Self Care | Payer: Self-pay | Source: Ambulatory Visit | Attending: Physician Assistant | Admitting: Physician Assistant

## 2022-03-01 DIAGNOSIS — K219 Gastro-esophageal reflux disease without esophagitis: Secondary | ICD-10-CM

## 2022-03-03 ENCOUNTER — Ambulatory Visit (INDEPENDENT_AMBULATORY_CARE_PROVIDER_SITE_OTHER): Payer: No Typology Code available for payment source

## 2022-03-03 DIAGNOSIS — J309 Allergic rhinitis, unspecified: Secondary | ICD-10-CM | POA: Diagnosis not present

## 2022-03-07 ENCOUNTER — Ambulatory Visit: Payer: Self-pay

## 2022-03-08 ENCOUNTER — Ambulatory Visit: Payer: 59 | Admitting: Allergy

## 2022-03-10 ENCOUNTER — Ambulatory Visit (INDEPENDENT_AMBULATORY_CARE_PROVIDER_SITE_OTHER): Payer: No Typology Code available for payment source

## 2022-03-10 DIAGNOSIS — J309 Allergic rhinitis, unspecified: Secondary | ICD-10-CM

## 2022-03-15 ENCOUNTER — Other Ambulatory Visit: Payer: Self-pay | Admitting: Allergy

## 2022-03-15 ENCOUNTER — Ambulatory Visit (INDEPENDENT_AMBULATORY_CARE_PROVIDER_SITE_OTHER): Payer: No Typology Code available for payment source | Admitting: *Deleted

## 2022-03-15 DIAGNOSIS — J309 Allergic rhinitis, unspecified: Secondary | ICD-10-CM | POA: Diagnosis not present

## 2022-03-23 DIAGNOSIS — J3089 Other allergic rhinitis: Secondary | ICD-10-CM | POA: Diagnosis not present

## 2022-03-23 NOTE — Progress Notes (Signed)
VIALS EXP 03-24-23 

## 2022-03-24 ENCOUNTER — Ambulatory Visit (INDEPENDENT_AMBULATORY_CARE_PROVIDER_SITE_OTHER): Payer: No Typology Code available for payment source | Admitting: *Deleted

## 2022-03-24 DIAGNOSIS — J309 Allergic rhinitis, unspecified: Secondary | ICD-10-CM | POA: Diagnosis not present

## 2022-03-31 ENCOUNTER — Ambulatory Visit (INDEPENDENT_AMBULATORY_CARE_PROVIDER_SITE_OTHER): Payer: No Typology Code available for payment source

## 2022-03-31 DIAGNOSIS — J309 Allergic rhinitis, unspecified: Secondary | ICD-10-CM | POA: Diagnosis not present

## 2022-04-05 ENCOUNTER — Ambulatory Visit (INDEPENDENT_AMBULATORY_CARE_PROVIDER_SITE_OTHER): Payer: No Typology Code available for payment source | Admitting: *Deleted

## 2022-04-05 DIAGNOSIS — J309 Allergic rhinitis, unspecified: Secondary | ICD-10-CM | POA: Diagnosis not present

## 2022-04-12 ENCOUNTER — Ambulatory Visit (INDEPENDENT_AMBULATORY_CARE_PROVIDER_SITE_OTHER): Payer: No Typology Code available for payment source

## 2022-04-12 DIAGNOSIS — J309 Allergic rhinitis, unspecified: Secondary | ICD-10-CM | POA: Diagnosis not present

## 2022-04-15 DIAGNOSIS — M7551 Bursitis of right shoulder: Secondary | ICD-10-CM | POA: Insufficient documentation

## 2022-04-19 ENCOUNTER — Ambulatory Visit: Payer: 59 | Admitting: Allergy

## 2022-04-19 NOTE — Progress Notes (Unsigned)
Follow Up Note  RE: Brooke Navarro MRN: [id number] DOB: 21-Jul-1975 Date of Office Visit: 04/20/2022  Referring provider: Tally Joe, MD Primary care provider: Tally Joe, MD  Chief Complaint: No chief complaint on file.  History of Present Illness: I had the pleasure of seeing Brooke Navarro for a [facility]  on [facility]/20[facility]. She is a 46 y.o. female, who is being followed for allergic rhinoconjunctivitis on AIT, cough variant asthma, GERD. Her previous allergy office visit was on 12/27/2021 with Dr. Selena Navarro. Today is a regular follow up visit.  Seasonal and perennial allergic rhinoconjunctivitis Past history - Perennial rhinoconjunctivitis symptoms with worsening the fall and winter - coughing is most bothersome.  2022 skin testing showed: Positive to grass, ragweed, weed, trees, dust mites, cat, dog, mouse. Borderline to mold. Started AIT on 02/16/2021 (G-W-RW-T, DM-C-D) Interim history - localized reactions improved. Continue environmental control measures. Use over the counter antihistamines such as Zyrtec (cetirizine), Claritin (loratadine), Allegra (fexofenadine), or Xyzal (levocetirizine) daily as needed. May take twice a day during allergy flares. May switch antihistamines every few months. Continue Singulair (montelukast) 10mg  daily at night. May use Ryaltris (olopatadine + mometasone nasal spray combination) 1-2 sprays per nostril twice a day.   Continue allergy injections - will move forward with red vial using schedule A. Given today.  May take an additional allegra in the morning of allergy injections.  Nasal saline spray (i.e., Simply Saline) or nasal saline lavage (i.e., NeilMed) is recommended as needed and prior to medicated nasal sprays.   Cough variant asthma Past history - Patient noted coughing with posttussive emesis at times for the last few years.  Using albuterol nebulizer less than once a week with good benefit.  History of  pleurisy.  No prior COVID-19 infection.  Finished treatment for latent TB.  Normal chest x-ray in 2021.  Takes famotidine for reflux. 2022 spirometry was normal with 4% improvement in FEV1 post bronchodilator treatment.  Clinically feeling improved. Interim history - started Airduo which worsened cough in May, not sure if this is due from GERD or not. Denies any fevers, weight loss.  Will start on daily inhaler to see if coughing improves. If no improvement get CXR as well.  Today's spirometry showed some restriction. Daily controller medication(s): START Symbicort June 2 puffs twice a day with spacer and rinse mouth afterwards. If not covered let me know.  Spacer given and demonstrated proper use with inhaler. Patient understood technique and all questions/concerned were addressed.  May use albuterol rescue inhaler 2 puffs every 4 to 6 hours as needed for shortness of breath, chest tightness, coughing, and wheezing. May use albuterol rescue inhaler 2 puffs 5 to 15 minutes prior to strenuous physical activities. Monitor frequency of use.  Get spirometry at next visit.   Gastroesophageal reflux disease Not controlled. Followed by GI. Continue lifestyle and dietary modifications. Continue Protonix 40mg  twice a day. Follow up with GI as scheduled.  Assessment and Plan: Brooke Navarro is a 46 y.o. female with: No problem-specific Assessment & Plan notes found for this encounter.  No follow-ups on file.  No orders of the defined types were placed in this encounter.  Lab Orders  No laboratory test(s) ordered today    Diagnostics: Spirometry:  Tracings reviewed. Her effort: {Blank single:19197::"Good reproducible efforts.","It was hard to get consistent efforts and there is a question as to whether this reflects a maximal maneuver.","Poor effort, data can not be interpreted."} FVC: ***L FEV1: ***L, ***%  predicted FEV1/FVC ratio: ***% Interpretation: {Blank single:19197::"Spirometry consistent  with mild obstructive disease","Spirometry consistent with moderate obstructive disease","Spirometry consistent with severe obstructive disease","Spirometry consistent with possible restrictive disease","Spirometry consistent with mixed obstructive and restrictive disease","Spirometry uninterpretable due to technique","Spirometry consistent with normal pattern","No overt abnormalities noted given today's efforts"}.  Please see scanned spirometry results for details.  Skin Testing: {Blank single:19197::"Select foods","Environmental allergy panel","Environmental allergy panel and select foods","Food allergy panel","None","Deferred due to recent antihistamines use"}. *** Results discussed with patient/family.   Medication List:  Current Outpatient Medications  Medication Sig Dispense Refill  . albuterol (VENTOLIN HFA) 108 (90 Base) MCG/ACT inhaler Inhale 2 puffs into the lungs every 4 hours as needed for wheezing, shortness of breath, or coughing fits. 8.5 g 0  . budesonide-formoterol (SYMBICORT) 80-4.5 MCG/ACT inhaler Inhale 2 puffs into the lungs in the morning and at bedtime. with spacer and rinse mouth afterwards. 1 each 3  . clindamycin (CLINDAGEL) 1 % gel Apply 1 application  topically 2 (two) times daily.    . clobetasol ointment (TEMOVATE) 0.05 % Apply topically 2 (two) times daily.    . cyclobenzaprine (FLEXERIL) 10 MG tablet     . diphenhydrAMINE (BENADRYL) 25 MG tablet     . escitalopram (LEXAPRO) 10 MG tablet Take 10 mg by mouth daily.    . fexofenadine (ALLEGRA ALLERGY) 180 MG tablet Take 1 tablet (180 mg total) by mouth daily for 15 days. 15 tablet 0  . JUNEL FE 1/20 1-20 MG-MCG tablet Take by mouth.    . levocetirizine (XYZAL) 5 MG tablet     . Misc Natural Products (COSAMIN ASU FOR JOINT HEALTH) CAPS     . montelukast (SINGULAIR) 10 MG tablet Take 1 tablet by mouth at bedtime. 30 tablet 2  . Multiple Vitamin (MULTI-VITAMIN) tablet Take 1 tablet by mouth daily.    Marrion Coy (RYALTRIS) [id number] MCG/ACT SUSP Place 1-2 sprays into the nose in the morning and at bedtime. 29 g 5  . pantoprazole (PROTONIX) 40 MG tablet Take 40 mg by mouth daily.     No current facility-administered medications for this visit.   Allergies: No Known Allergies I reviewed her past medical history, social history, family history, and environmental history and no significant changes have been reported from her previous visit.  Review of Systems  Constitutional:  Negative for appetite change, chills, fever and unexpected weight change.  HENT:  Negative for congestion and rhinorrhea.   Eyes:  Negative for itching.  Respiratory:  Positive for cough. Negative for chest tightness, shortness of breath and wheezing.   Cardiovascular:  Negative for chest pain.  Gastrointestinal:  Negative for abdominal pain.  Genitourinary:  Negative for difficulty urinating.  Skin:  Negative for rash.  Allergic/Immunologic: Positive for environmental allergies.  Neurological:  Negative for headaches.   Objective: There were no vitals taken for this visit. There is no height or weight on file to calculate BMI. Physical Exam Vitals and nursing note reviewed.  Constitutional:      Appearance: Normal appearance. She is well-developed.  HENT:     Head: Normocephalic and atraumatic.     Right Ear: Tympanic membrane and external ear normal.     Left Ear: Tympanic membrane and external ear normal.     Nose: Nose normal.     Mouth/Throat:     Mouth: Mucous membranes are moist.     Pharynx: Oropharynx is clear.  Eyes:     Conjunctiva/sclera: Conjunctivae normal.  Cardiovascular:     Rate and  Rhythm: Normal rate and regular rhythm.     Heart sounds: Normal heart sounds. No murmur heard.    No friction rub. No gallop.  Pulmonary:     Effort: Pulmonary effort is normal.     Breath sounds: Normal breath sounds. No wheezing, rhonchi or rales.  Abdominal:     Palpations: Abdomen is soft.   Musculoskeletal:     Cervical back: Neck supple.  Skin:    General: Skin is warm.     Findings: No rash.  Neurological:     Mental Status: She is alert and oriented to person, place, and time.  Psychiatric:        Behavior: Behavior normal.  Previous notes and tests were reviewed. The plan was reviewed with the patient/family, and all questions/concerned were addressed.  It was my pleasure to see Dea today and participate in her care. Please feel free to contact me with any questions or concerns.  Sincerely,  Wyline Mood, DO Allergy & Immunology  [facility] office: [phone] [facility] office: [phone]

## 2022-04-20 ENCOUNTER — Other Ambulatory Visit: Payer: Self-pay

## 2022-04-20 ENCOUNTER — Ambulatory Visit (INDEPENDENT_AMBULATORY_CARE_PROVIDER_SITE_OTHER): Payer: PRIVATE HEALTH INSURANCE | Admitting: Allergy

## 2022-04-20 ENCOUNTER — Encounter: Payer: Self-pay | Admitting: Allergy

## 2022-04-20 ENCOUNTER — Ambulatory Visit (INDEPENDENT_AMBULATORY_CARE_PROVIDER_SITE_OTHER): Payer: PRIVATE HEALTH INSURANCE | Admitting: *Deleted

## 2022-04-20 VITALS — BP 132/72 | HR 70 | Temp 97.8°F | Resp 18 | Ht 69.0 in | Wt 219.9 lb

## 2022-04-20 DIAGNOSIS — J45991 Cough variant asthma: Secondary | ICD-10-CM

## 2022-04-20 DIAGNOSIS — J302 Other seasonal allergic rhinitis: Secondary | ICD-10-CM | POA: Diagnosis not present

## 2022-04-20 DIAGNOSIS — K219 Gastro-esophageal reflux disease without esophagitis: Secondary | ICD-10-CM

## 2022-04-20 DIAGNOSIS — H1013 Acute atopic conjunctivitis, bilateral: Secondary | ICD-10-CM

## 2022-04-20 DIAGNOSIS — J309 Allergic rhinitis, unspecified: Secondary | ICD-10-CM

## 2022-04-20 DIAGNOSIS — H1010 Acute atopic conjunctivitis, unspecified eye: Secondary | ICD-10-CM

## 2022-04-20 MED ORDER — RYALTRIS 665-25 MCG/ACT NA SUSP: 1.0000 | Freq: Two times a day (BID) | NASAL | 5 refills | Status: DC

## 2022-04-20 MED ORDER — MONTELUKAST SODIUM 10 MG PO TABS: 10.0000 mg | ORAL_TABLET | Freq: Every day | ORAL | 3 refills | Status: DC

## 2022-04-20 NOTE — Patient Instructions (Addendum)
Environmental allergies 2022 skin testing showed: Positive to grass, ragweed, weed, trees, dust mites, cat, dog, mouse. Borderline to mold.  Continue environmental control measures. Use over the counter antihistamines such as Zyrtec (cetirizine), Claritin (loratadine), Allegra (fexofenadine), or Xyzal (levocetirizine) daily as needed. May take twice a day during allergy flares. May switch antihistamines every few months. Continue Singulair (montelukast) 10mg  daily at night. Start Ryaltris (olopatadine + mometasone nasal spray combination) 1-2 sprays per nostril twice a day. Sample given. This replaces your other nasal sprays. If this works well for you, then have [facility] ship the medication to your home - prescription already sent in.   Continue allergy injections - given today.  May take an additional allegra in the morning of allergy injections.  Nasal saline spray (i.e., Simply Saline) or nasal saline lavage (i.e., NeilMed) is recommended as needed and prior to medicated nasal sprays.  Coughing: Daily controller medication(s): Symbicort 2 puffs twice a day with spacer and rinse mouth afterwards. May use albuterol rescue inhaler 2 puffs every 4 to 6 hours as needed for shortness of breath, chest tightness, coughing, and wheezing. May use albuterol rescue inhaler 2 puffs 5 to 15 minutes prior to strenuous physical activities. Monitor frequency of use.  Asthma control goals:  Full participation in all desired activities (may need albuterol before activity) Albuterol use two times or less a week on average (not counting use with activity) Cough interfering with sleep two times or less a month Oral steroids no more than once a year No hospitalizations   Heartburn: Continue lifestyle and dietary modifications. Continue Protonix 40mg  twice a day. Continue famotidine 40mg  twice a day.  Follow up with GI.   Follow up in 4 months or sooner if needed.

## 2022-04-20 NOTE — Assessment & Plan Note (Signed)
Past history - Patient noted coughing with posttussive emesis at times for the last few years. History of pleurisy. Finished treatment for latent TB.  Normal chest x-ray in 2021.  2022 spirometry was normal with 4% improvement in FEV1 post bronchodilator treatment.  Clinically feeling improved. Airduo worsened cough.  Interim history - coughing improved with Symbicort and better GERD control.  Today's spirometry was normal.  Daily controller medication(s): Symbicort 2 puffs twice a day with spacer and rinse mouth afterwards. May use albuterol rescue inhaler 2 puffs every 4 to 6 hours as needed for shortness of breath, chest tightness, coughing, and wheezing. May use albuterol rescue inhaler 2 puffs 5 to 15 minutes prior to strenuous physical activities. Monitor frequency of use.  Get spirometry at next visit.

## 2022-04-20 NOTE — Assessment & Plan Note (Signed)
Followed by GI. Continue lifestyle and dietary modifications. Continue Protonix 40mg  twice a day. Continue famotidine 40mg  twice a day.  Follow up with GI.

## 2022-04-20 NOTE — Assessment & Plan Note (Signed)
Past history - Perennial rhinoconjunctivitis symptoms with worsening the fall and winter - coughing is most bothersome.  2022 skin testing showed: Positive to grass, ragweed, weed, trees, dust mites, cat, dog, mouse. Borderline to mold. Started AIT on 02/16/2021 (G-W-RW-T, DM-C-D) Interim history - localized reactions improved with Allegra premedication. Continue environmental control measures. Use over the counter antihistamines such as Zyrtec (cetirizine), Claritin (loratadine), Allegra (fexofenadine), or Xyzal (levocetirizine) daily as needed. May take twice a day during allergy flares. May switch antihistamines every few months. Continue Singulair (montelukast) 10mg  daily at night. Start Ryaltris (olopatadine + mometasone nasal spray combination) 1-2 sprays per nostril twice a day. Sample given. This replaces your other nasal sprays. If this works well for you, then have [facility] ship the medication to your home - prescription already sent in.   Continue allergy injections - given today.  May take an additional allegra in the morning of allergy injections.  Nasal saline spray (i.e., Simply Saline) or nasal saline lavage (i.e., NeilMed) is recommended as needed and prior to medicated nasal sprays.

## 2022-04-27 ENCOUNTER — Ambulatory Visit (INDEPENDENT_AMBULATORY_CARE_PROVIDER_SITE_OTHER): Payer: PRIVATE HEALTH INSURANCE | Admitting: *Deleted

## 2022-04-27 DIAGNOSIS — J309 Allergic rhinitis, unspecified: Secondary | ICD-10-CM

## 2022-05-06 ENCOUNTER — Ambulatory Visit (INDEPENDENT_AMBULATORY_CARE_PROVIDER_SITE_OTHER): Payer: PRIVATE HEALTH INSURANCE

## 2022-05-06 DIAGNOSIS — J309 Allergic rhinitis, unspecified: Secondary | ICD-10-CM | POA: Diagnosis not present

## 2022-05-13 ENCOUNTER — Ambulatory Visit (INDEPENDENT_AMBULATORY_CARE_PROVIDER_SITE_OTHER): Payer: PRIVATE HEALTH INSURANCE | Admitting: *Deleted

## 2022-05-13 DIAGNOSIS — J309 Allergic rhinitis, unspecified: Secondary | ICD-10-CM | POA: Diagnosis not present

## 2022-05-17 ENCOUNTER — Ambulatory Visit (INDEPENDENT_AMBULATORY_CARE_PROVIDER_SITE_OTHER): Payer: PRIVATE HEALTH INSURANCE

## 2022-05-17 DIAGNOSIS — J309 Allergic rhinitis, unspecified: Secondary | ICD-10-CM | POA: Diagnosis not present

## 2022-05-25 ENCOUNTER — Ambulatory Visit (INDEPENDENT_AMBULATORY_CARE_PROVIDER_SITE_OTHER): Payer: PRIVATE HEALTH INSURANCE

## 2022-05-25 DIAGNOSIS — J309 Allergic rhinitis, unspecified: Secondary | ICD-10-CM | POA: Diagnosis not present

## 2022-05-27 DIAGNOSIS — F320 Major depressive disorder, single episode, mild: Secondary | ICD-10-CM | POA: Insufficient documentation

## 2022-06-01 DIAGNOSIS — J3089 Other allergic rhinitis: Secondary | ICD-10-CM

## 2022-06-01 NOTE — Progress Notes (Signed)
VIALS EXP 06-02-23 

## 2022-06-02 ENCOUNTER — Ambulatory Visit (INDEPENDENT_AMBULATORY_CARE_PROVIDER_SITE_OTHER): Payer: PRIVATE HEALTH INSURANCE

## 2022-06-02 DIAGNOSIS — J309 Allergic rhinitis, unspecified: Secondary | ICD-10-CM

## 2022-06-08 ENCOUNTER — Encounter: Payer: Self-pay | Admitting: Obstetrics and Gynecology

## 2022-06-08 DIAGNOSIS — Z1231 Encounter for screening mammogram for malignant neoplasm of breast: Secondary | ICD-10-CM

## 2022-06-09 ENCOUNTER — Ambulatory Visit (INDEPENDENT_AMBULATORY_CARE_PROVIDER_SITE_OTHER): Payer: PRIVATE HEALTH INSURANCE

## 2022-06-09 DIAGNOSIS — J309 Allergic rhinitis, unspecified: Secondary | ICD-10-CM

## 2022-06-17 ENCOUNTER — Ambulatory Visit (INDEPENDENT_AMBULATORY_CARE_PROVIDER_SITE_OTHER): Payer: PRIVATE HEALTH INSURANCE

## 2022-06-17 DIAGNOSIS — J309 Allergic rhinitis, unspecified: Secondary | ICD-10-CM | POA: Diagnosis not present

## 2022-06-24 ENCOUNTER — Ambulatory Visit (INDEPENDENT_AMBULATORY_CARE_PROVIDER_SITE_OTHER): Payer: PRIVATE HEALTH INSURANCE

## 2022-06-24 DIAGNOSIS — J309 Allergic rhinitis, unspecified: Secondary | ICD-10-CM

## 2022-06-28 ENCOUNTER — Ambulatory Visit (INDEPENDENT_AMBULATORY_CARE_PROVIDER_SITE_OTHER): Payer: PRIVATE HEALTH INSURANCE

## 2022-06-28 DIAGNOSIS — J309 Allergic rhinitis, unspecified: Secondary | ICD-10-CM

## 2022-07-06 ENCOUNTER — Ambulatory Visit (INDEPENDENT_AMBULATORY_CARE_PROVIDER_SITE_OTHER): Payer: PRIVATE HEALTH INSURANCE

## 2022-07-06 DIAGNOSIS — J309 Allergic rhinitis, unspecified: Secondary | ICD-10-CM | POA: Diagnosis not present

## 2022-07-15 ENCOUNTER — Ambulatory Visit (INDEPENDENT_AMBULATORY_CARE_PROVIDER_SITE_OTHER): Payer: PRIVATE HEALTH INSURANCE

## 2022-07-15 DIAGNOSIS — J309 Allergic rhinitis, unspecified: Secondary | ICD-10-CM

## 2022-07-20 ENCOUNTER — Ambulatory Visit (INDEPENDENT_AMBULATORY_CARE_PROVIDER_SITE_OTHER): Payer: PRIVATE HEALTH INSURANCE

## 2022-07-20 DIAGNOSIS — J309 Allergic rhinitis, unspecified: Secondary | ICD-10-CM

## 2022-07-26 ENCOUNTER — Ambulatory Visit (INDEPENDENT_AMBULATORY_CARE_PROVIDER_SITE_OTHER): Payer: PRIVATE HEALTH INSURANCE

## 2022-07-26 DIAGNOSIS — J309 Allergic rhinitis, unspecified: Secondary | ICD-10-CM

## 2022-08-03 ENCOUNTER — Ambulatory Visit (INDEPENDENT_AMBULATORY_CARE_PROVIDER_SITE_OTHER): Payer: PRIVATE HEALTH INSURANCE

## 2022-08-03 DIAGNOSIS — J309 Allergic rhinitis, unspecified: Secondary | ICD-10-CM | POA: Diagnosis not present

## 2022-08-18 ENCOUNTER — Ambulatory Visit (INDEPENDENT_AMBULATORY_CARE_PROVIDER_SITE_OTHER): Payer: PRIVATE HEALTH INSURANCE

## 2022-08-18 DIAGNOSIS — J309 Allergic rhinitis, unspecified: Secondary | ICD-10-CM

## 2022-08-21 NOTE — Progress Notes (Unsigned)
Follow Up Note  RE: Tallis Durban MRN: [id number] DOB: [birth year] Date of Office Visit: 08/22/2022  Referring provider: Antony Contras, MD Primary care provider: Antony Contras, MD  Chief Complaint: No chief complaint on file.  History of Present Illness: I had the pleasure of seeing Brooke Navarro for a follow up visit at the [facility] on 08/21/2022. She is a 47 y.o. female, who is being followed for cough variant asthma, allergic rhinoconjunctivitis on AIT and GERD. Her previous allergy office visit was on 04/20/2022 with Dr. Maudie Mercury. Today is a regular follow up visit.  Cough variant asthma Past history - Patient noted coughing with posttussive emesis at times for the last few years. History of pleurisy. Finished treatment for latent TB.  Normal chest x-ray in 2021.  2022 spirometry was normal with 4% improvement in FEV1 post bronchodilator treatment.  Clinically feeling improved. Airduo worsened cough.  Interim history - coughing improved with Symbicort and better GERD control.  Today's spirometry was normal.  Daily controller medication(s): Symbicort 73mcg 2 puffs twice a day with spacer and rinse mouth afterwards. May use albuterol rescue inhaler 2 puffs every 4 to 6 hours as needed for shortness of breath, chest tightness, coughing, and wheezing. May use albuterol rescue inhaler 2 puffs 5 to 15 minutes prior to strenuous physical activities. Monitor frequency of use.  Get spirometry at next visit.   Seasonal and perennial allergic rhinoconjunctivitis Past history - Perennial rhinoconjunctivitis symptoms with worsening the fall and winter - coughing is most bothersome.  2022 skin testing showed: Positive to grass, ragweed, weed, trees, dust mites, cat, dog, mouse. Borderline to mold. Started AIT on 02/16/2021 (G-W-RW-T, DM-C-D) Interim history - localized reactions improved with Allegra premedication. Continue environmental control measures. Use over the counter  antihistamines such as Zyrtec (cetirizine), Claritin (loratadine), Allegra (fexofenadine), or Xyzal (levocetirizine) daily as needed. May take twice a day during allergy flares. May switch antihistamines every few months. Continue Singulair (montelukast) 10mg  daily at night. Start Ryaltris (olopatadine + mometasone nasal spray combination) 1-2 sprays per nostril twice a day. Sample given. This replaces your other nasal sprays. If this works well for you, then have [facility] ship the medication to your home - prescription already sent in.   Continue allergy injections - given today.  May take an additional allegra in the morning of allergy injections.  Nasal saline spray (i.e., Simply Saline) or nasal saline lavage (i.e., NeilMed) is recommended as needed and prior to medicated nasal sprays.   Gastroesophageal reflux disease Followed by GI. Continue lifestyle and dietary modifications. Continue Protonix 40mg  twice a day. Continue famotidine 40mg  twice a day.  Follow up with GI.    Return in about 4 months (around 08/19/2022).  Assessment and Plan: Brooke Navarro is a 47 y.o. female with: No problem-specific Assessment & Plan notes found for this encounter.  No follow-ups on file.  No orders of the defined types were placed in this encounter.  Lab Orders  No laboratory test(s) ordered today    Diagnostics: Spirometry:  Tracings reviewed. Her effort: {Blank single:19197::"Good reproducible efforts.","It was hard to get consistent efforts and there is a question as to whether this reflects a maximal maneuver.","Poor effort, data can not be interpreted."} FVC: ***L FEV1: ***L, ***% predicted FEV1/FVC ratio: ***% Interpretation: {Blank single:19197::"Spirometry consistent with mild obstructive disease","Spirometry consistent with moderate obstructive disease","Spirometry consistent with severe obstructive disease","Spirometry consistent with possible restrictive disease","Spirometry consistent  with mixed obstructive and restrictive disease","Spirometry uninterpretable due to technique","Spirometry consistent  with normal pattern","No overt abnormalities noted given today's efforts"}.  Please see scanned spirometry results for details.  Skin Testing: {Blank single:19197::"Select foods","Environmental allergy panel","Environmental allergy panel and select foods","Food allergy panel","None","Deferred due to recent antihistamines use"}. *** Results discussed with patient/family.   Medication List:  Current Outpatient Medications  Medication Sig Dispense Refill  . albuterol (VENTOLIN HFA) 108 (90 Base) MCG/ACT inhaler Inhale 2 puffs into the lungs every 4 hours as needed for wheezing, shortness of breath, or coughing fits. 8.5 g 0  . budesonide-formoterol (SYMBICORT) 80-4.5 MCG/ACT inhaler Inhale 2 puffs into the lungs in the morning and at bedtime. with spacer and rinse mouth afterwards. 1 each 3  . clindamycin (CLINDAGEL) 1 % gel Apply 1 application  topically 2 (two) times daily.    . clobetasol ointment (TEMOVATE) 0.05 % Apply topically 2 (two) times daily.    . cyclobenzaprine (FLEXERIL) 10 MG tablet     . diphenhydrAMINE (BENADRYL) 25 MG tablet     . escitalopram (LEXAPRO) 10 MG tablet Take 10 mg by mouth daily.    . fexofenadine (ALLEGRA ALLERGY) 180 MG tablet Take 1 tablet (180 mg total) by mouth daily for 15 days. 15 tablet 0  . JUNEL FE 1/20 1-20 MG-MCG tablet Take by mouth.    . levocetirizine (XYZAL) 5 MG tablet     . Misc Natural Products ([place]) CAPS     . montelukast (SINGULAIR) 10 MG tablet Take 1 tablet (10 mg total) by mouth at bedtime. 90 tablet 3  . Multiple Vitamin (MULTI-VITAMIN) tablet Take 1 tablet by mouth daily.    Donata Clay (RYALTRIS) [id number] MCG/ACT SUSP Place 1-2 sprays into the nose in the morning and at bedtime. 29 g 5  . pantoprazole (PROTONIX) 40 MG tablet Take 40 mg by mouth daily.     No current  facility-administered medications for this visit.   Allergies: No Known Allergies I reviewed her past medical history, social history, family history, and environmental history and no significant changes have been reported from her previous visit.  Review of Systems  Constitutional:  Negative for appetite change, chills, fever and unexpected weight change.  HENT:  Negative for congestion and rhinorrhea.   Eyes:  Negative for itching.  Respiratory:  Positive for cough. Negative for chest tightness, shortness of breath and wheezing.   Cardiovascular:  Negative for chest pain.  Gastrointestinal:  Negative for abdominal pain.  Genitourinary:  Negative for difficulty urinating.  Skin:  Negative for rash.  Allergic/Immunologic: Positive for environmental allergies.  Neurological:  Negative for headaches.   Objective: There were no vitals taken for this visit. There is no height or weight on file to calculate BMI. Physical Exam Vitals and nursing note reviewed.  Constitutional:      Appearance: Normal appearance. She is well-developed.  HENT:     Head: Normocephalic and atraumatic.     Right Ear: Tympanic membrane and external ear normal.     Left Ear: Tympanic membrane and external ear normal.     Nose: Nose normal.     Mouth/Throat:     Mouth: Mucous membranes are moist.     Pharynx: Oropharynx is clear.  Eyes:     Conjunctiva/sclera: Conjunctivae normal.  Cardiovascular:     Rate and Rhythm: Normal rate and regular rhythm.     Heart sounds: Normal heart sounds. No murmur heard.    No friction rub. No gallop.  Pulmonary:     Effort: Pulmonary effort is normal.  Breath sounds: Normal breath sounds. No wheezing, rhonchi or rales.  Abdominal:     Palpations: Abdomen is soft.  Musculoskeletal:     Cervical back: Neck supple.  Skin:    General: Skin is warm.     Findings: No rash.  Neurological:     Mental Status: She is alert and oriented to person, place, and time.   Psychiatric:        Behavior: Behavior normal.  Previous notes and tests were reviewed. The plan was reviewed with the patient/family, and all questions/concerned were addressed.  It was my pleasure to see Brooke Navarro today and participate in her care. Please feel free to contact me with any questions or concerns.  Sincerely,  Rexene Alberts, DO Allergy & Immunology  [facility] office: [place] office: [phone]

## 2022-08-22 ENCOUNTER — Other Ambulatory Visit: Payer: Self-pay

## 2022-08-22 ENCOUNTER — Encounter: Payer: Self-pay | Admitting: Allergy

## 2022-08-22 ENCOUNTER — Ambulatory Visit: Payer: PRIVATE HEALTH INSURANCE | Admitting: Allergy

## 2022-08-22 VITALS — BP 118/72 | HR 86 | Temp 98.7°F | Resp 14 | Ht 69.0 in | Wt 222.2 lb

## 2022-08-22 DIAGNOSIS — H1010 Acute atopic conjunctivitis, unspecified eye: Secondary | ICD-10-CM

## 2022-08-22 DIAGNOSIS — J45991 Cough variant asthma: Secondary | ICD-10-CM | POA: Diagnosis not present

## 2022-08-22 DIAGNOSIS — H1013 Acute atopic conjunctivitis, bilateral: Secondary | ICD-10-CM | POA: Diagnosis not present

## 2022-08-22 DIAGNOSIS — K219 Gastro-esophageal reflux disease without esophagitis: Secondary | ICD-10-CM

## 2022-08-22 DIAGNOSIS — J302 Other seasonal allergic rhinitis: Secondary | ICD-10-CM

## 2022-08-22 MED ORDER — MONTELUKAST SODIUM 10 MG PO TABS: 10.0000 mg | ORAL_TABLET | Freq: Every day | ORAL | 3 refills | Status: DC

## 2022-08-22 NOTE — Assessment & Plan Note (Signed)
Past history - Perennial rhinoconjunctivitis symptoms with worsening the fall and winter - coughing is most bothersome.  2022 skin testing showed: Positive to grass, ragweed, weed, trees, dust mites, cat, dog, mouse. Borderline to mold. Started AIT on 02/16/2021 (G-W-RW-T, DM-C-D). Interim history - doing well with AIT. Some increased symptoms with the pollen out.  Continue environmental control measures. Use over the counter antihistamines such as Zyrtec (cetirizine), Claritin (loratadine), Allegra (fexofenadine), or Xyzal (levocetirizine) daily as needed. May take twice a day during allergy flares. May switch antihistamines every few months. Continue Singulair (montelukast) 10mg  daily at night. May use Ryaltris (olopatadine + mometasone nasal spray combination) 1-2 sprays per nostril twice a day. Continue allergy injections every 2 weeks. Will build up new red vials faster (0.1cc, 0.3cc, 0.5cc) Nasal saline spray (i.e., Simply Saline) or nasal saline lavage (i.e., NeilMed) is recommended as needed and prior to medicated nasal sprays.

## 2022-08-22 NOTE — Assessment & Plan Note (Signed)
Past history - Patient noted coughing with posttussive emesis at times for the last few years. History of pleurisy. Finished treatment for latent TB.  Normal chest x-ray in 2021.  2022 spirometry was normal with 4% improvement in FEV1 post bronchodilator treatment.  Clinically feeling improved. Airduo worsened cough.  Interim history - coughing resolves as long as she takes her GERD meds. Stopped Symbicort 1 month ago with no significant issues.  Today's spirometry was normal.  Daily controller medication(s): none. If you notice issues then restart Symbicort 63mcg 2 puffs once a day and let us know. During respiratory infections/flares:  Start Symbicort 53mcg 2 puffs twice a day with spacer and rinse mouth afterwards for 1-2 weeks until your breathing symptoms return to baseline.  May use albuterol rescue inhaler 2 puffs every 4 to 6 hours as needed for shortness of breath, chest tightness, coughing, and wheezing. Monitor frequency of use.  Get spirometry at next visit.

## 2022-08-22 NOTE — Assessment & Plan Note (Signed)
Coughing resolved with below regimen but if she misses a dose for a few days the coughing returns. Does like to drink coffee in the mornings. Being followed by GI. Continue lifestyle and dietary modifications. Continue Protonix 40mg  twice a day. Continue famotidine 40mg  twice a day.  Follow up with GI.

## 2022-08-22 NOTE — Patient Instructions (Addendum)
Environmental allergies 2022 skin testing showed: Positive to grass, ragweed, weed, trees, dust mites, cat, dog, mouse. Borderline to mold.  Continue environmental control measures. Use over the counter antihistamines such as Zyrtec (cetirizine), Claritin (loratadine), Allegra (fexofenadine), or Xyzal (levocetirizine) daily as needed. May take twice a day during allergy flares. May switch antihistamines every few months. Continue Singulair (montelukast) 10mg  daily at night. May use Ryaltris (olopatadine + mometasone nasal spray combination) 1-2 sprays per nostril twice a day. Continue allergy injections every 2 weeks. Will build up new red vials faster (0.1cc, 0.3cc, 0.5cc) Nasal saline spray (i.e., Simply Saline) or nasal saline lavage (i.e., NeilMed) is recommended as needed and prior to medicated nasal sprays.  Breathing:  Daily controller medication(s): none. If you notice issues then restart Symbicort 30mcg 2 puffs once a day and let us know. During respiratory infections/flares:  Start Symbicort 72mcg 2 puffs twice a day with spacer and rinse mouth afterwards for 1-2 weeks until your breathing symptoms return to baseline.  May use albuterol rescue inhaler 2 puffs every 4 to 6 hours as needed for shortness of breath, chest tightness, coughing, and wheezing. Monitor frequency of use.  Breathing control goals:  Full participation in all desired activities (may need albuterol before activity) Albuterol use two times or less a week on average (not counting use with activity) Cough interfering with sleep two times or less a month Oral steroids no more than once a year No hospitalizations   Heartburn: Continue lifestyle and dietary modifications. Continue Protonix 40mg  twice a day. Continue famotidine 40mg  twice a day.  Follow up with GI.   Follow up in 6 months or sooner if needed.

## 2022-09-01 ENCOUNTER — Ambulatory Visit (INDEPENDENT_AMBULATORY_CARE_PROVIDER_SITE_OTHER): Payer: PRIVATE HEALTH INSURANCE

## 2022-09-01 DIAGNOSIS — J309 Allergic rhinitis, unspecified: Secondary | ICD-10-CM | POA: Diagnosis not present

## 2022-09-16 ENCOUNTER — Ambulatory Visit (INDEPENDENT_AMBULATORY_CARE_PROVIDER_SITE_OTHER): Payer: PRIVATE HEALTH INSURANCE

## 2022-09-16 DIAGNOSIS — J309 Allergic rhinitis, unspecified: Secondary | ICD-10-CM | POA: Diagnosis not present

## 2022-09-20 DIAGNOSIS — J3089 Other allergic rhinitis: Secondary | ICD-10-CM | POA: Diagnosis not present

## 2022-09-20 NOTE — Progress Notes (Signed)
VIALS EXP 09-20-23 

## 2022-09-27 ENCOUNTER — Ambulatory Visit (INDEPENDENT_AMBULATORY_CARE_PROVIDER_SITE_OTHER): Payer: PRIVATE HEALTH INSURANCE

## 2022-09-27 DIAGNOSIS — J309 Allergic rhinitis, unspecified: Secondary | ICD-10-CM | POA: Diagnosis not present

## 2022-10-11 ENCOUNTER — Ambulatory Visit (INDEPENDENT_AMBULATORY_CARE_PROVIDER_SITE_OTHER): Payer: PRIVATE HEALTH INSURANCE

## 2022-10-11 DIAGNOSIS — J309 Allergic rhinitis, unspecified: Secondary | ICD-10-CM | POA: Diagnosis not present

## 2022-10-26 ENCOUNTER — Ambulatory Visit (INDEPENDENT_AMBULATORY_CARE_PROVIDER_SITE_OTHER): Payer: PRIVATE HEALTH INSURANCE

## 2022-10-26 DIAGNOSIS — J309 Allergic rhinitis, unspecified: Secondary | ICD-10-CM

## 2022-11-09 ENCOUNTER — Ambulatory Visit (INDEPENDENT_AMBULATORY_CARE_PROVIDER_SITE_OTHER): Payer: PRIVATE HEALTH INSURANCE | Admitting: *Deleted

## 2022-11-09 DIAGNOSIS — J309 Allergic rhinitis, unspecified: Secondary | ICD-10-CM

## 2022-11-17 ENCOUNTER — Ambulatory Visit (INDEPENDENT_AMBULATORY_CARE_PROVIDER_SITE_OTHER): Payer: PRIVATE HEALTH INSURANCE

## 2022-11-17 DIAGNOSIS — J309 Allergic rhinitis, unspecified: Secondary | ICD-10-CM | POA: Diagnosis not present

## 2022-11-24 ENCOUNTER — Ambulatory Visit (INDEPENDENT_AMBULATORY_CARE_PROVIDER_SITE_OTHER): Payer: PRIVATE HEALTH INSURANCE

## 2022-11-24 DIAGNOSIS — J309 Allergic rhinitis, unspecified: Secondary | ICD-10-CM | POA: Diagnosis not present

## 2022-12-12 ENCOUNTER — Ambulatory Visit (INDEPENDENT_AMBULATORY_CARE_PROVIDER_SITE_OTHER): Payer: PRIVATE HEALTH INSURANCE | Admitting: *Deleted

## 2022-12-12 DIAGNOSIS — J309 Allergic rhinitis, unspecified: Secondary | ICD-10-CM | POA: Diagnosis not present

## 2022-12-23 ENCOUNTER — Ambulatory Visit (INDEPENDENT_AMBULATORY_CARE_PROVIDER_SITE_OTHER): Payer: PRIVATE HEALTH INSURANCE

## 2022-12-23 DIAGNOSIS — J309 Allergic rhinitis, unspecified: Secondary | ICD-10-CM | POA: Diagnosis not present

## 2023-01-06 ENCOUNTER — Ambulatory Visit (INDEPENDENT_AMBULATORY_CARE_PROVIDER_SITE_OTHER): Payer: PRIVATE HEALTH INSURANCE

## 2023-01-06 DIAGNOSIS — J309 Allergic rhinitis, unspecified: Secondary | ICD-10-CM | POA: Diagnosis not present

## 2023-01-20 ENCOUNTER — Ambulatory Visit (INDEPENDENT_AMBULATORY_CARE_PROVIDER_SITE_OTHER): Payer: PRIVATE HEALTH INSURANCE

## 2023-01-20 DIAGNOSIS — J309 Allergic rhinitis, unspecified: Secondary | ICD-10-CM | POA: Diagnosis not present

## 2023-02-01 ENCOUNTER — Ambulatory Visit (INDEPENDENT_AMBULATORY_CARE_PROVIDER_SITE_OTHER): Payer: PRIVATE HEALTH INSURANCE | Admitting: *Deleted

## 2023-02-01 DIAGNOSIS — J309 Allergic rhinitis, unspecified: Secondary | ICD-10-CM

## 2023-02-02 DIAGNOSIS — J3089 Other allergic rhinitis: Secondary | ICD-10-CM | POA: Diagnosis not present

## 2023-02-02 NOTE — Progress Notes (Signed)
VIALS EXP 02-02-24

## 2023-02-16 ENCOUNTER — Ambulatory Visit (INDEPENDENT_AMBULATORY_CARE_PROVIDER_SITE_OTHER): Payer: PRIVATE HEALTH INSURANCE

## 2023-02-16 DIAGNOSIS — J309 Allergic rhinitis, unspecified: Secondary | ICD-10-CM | POA: Diagnosis not present

## 2023-02-27 ENCOUNTER — Ambulatory Visit: Payer: PRIVATE HEALTH INSURANCE | Admitting: Allergy

## 2023-03-03 ENCOUNTER — Ambulatory Visit (INDEPENDENT_AMBULATORY_CARE_PROVIDER_SITE_OTHER): Payer: PRIVATE HEALTH INSURANCE

## 2023-03-03 DIAGNOSIS — J309 Allergic rhinitis, unspecified: Secondary | ICD-10-CM | POA: Diagnosis not present

## 2023-03-05 NOTE — Progress Notes (Unsigned)
Follow Up Note  RE: Brooke Navarro MRN: [id number] DOB: [birth year] Date of Office Visit: 03/06/2023  Referring provider: Tally Joe, MD Primary care provider: Tally Joe, MD  Chief Complaint: No chief complaint on file.  History of Present Illness: I had the pleasure of seeing Brooke Navarro for a follow up visit at the [facility] on 03/05/2023. She is a 47 y.o. female, who is being followed for cough variant asthma, allergic rhinoconjunctivitis on AIT, GERD. Her previous allergy office visit was on 08/22/2022 with Dr. Selena Batten. Today is a regular follow up visit.  Discussed the use of AI scribe software for clinical note transcription with the patient, who gave verbal consent to proceed.  History of Present Illness            Cough variant asthma Past history - Patient noted coughing with posttussive emesis at times for the last few years. History of pleurisy. Finished treatment for latent TB.  Normal chest x-ray in 2021.  2022 spirometry was normal with 4% improvement in FEV1 post bronchodilator treatment.  Clinically feeling improved. Airduo worsened cough.  Interim history - coughing resolves as long as she takes her GERD meds. Stopped Symbicort 1 month ago with no significant issues.  Today's spirometry was normal.  Daily controller medication(s): none. If you notice issues then restart Symbicort 2 puffs once a day and let us know. During respiratory infections/flares:  Start Symbicort 2 puffs twice a day with spacer and rinse mouth afterwards for 1-2 weeks until your breathing symptoms return to baseline.  May use albuterol rescue inhaler 2 puffs every 4 to 6 hours as needed for shortness of breath, chest tightness, coughing, and wheezing. Monitor frequency of use.  Get spirometry at next visit.   Seasonal and perennial allergic rhinoconjunctivitis Past history - Perennial rhinoconjunctivitis symptoms with worsening the fall and winter - coughing  is most bothersome.  2022 skin testing showed: Positive to grass, ragweed, weed, trees, dust mites, cat, dog, mouse. Borderline to mold. Started AIT on 02/16/2021 (G-W-RW-T, DM-C-D). Interim history - doing well with AIT. Some increased symptoms with the pollen out.  Continue environmental control measures. Use over the counter antihistamines such as Zyrtec (cetirizine), Claritin (loratadine), Allegra (fexofenadine), or Xyzal (levocetirizine) daily as needed. May take twice a day during allergy flares. May switch antihistamines every few months. Continue Singulair (montelukast) 10mg  daily at night. May use Ryaltris (olopatadine + mometasone nasal spray combination) 1-2 sprays per nostril twice a day. Continue allergy injections every 2 weeks. Will build up new red vials faster (0.1cc, 0.3cc, 0.5cc) Nasal saline spray (i.e., Simply Saline) or nasal saline lavage (i.e., NeilMed) is recommended as needed and prior to medicated nasal sprays.   Gastroesophageal reflux disease Coughing resolved with below regimen but if she misses a dose for a few days the coughing returns. Does like to drink coffee in the mornings. Being followed by GI. Continue lifestyle and dietary modifications. Continue Protonix 40mg  twice a day. Continue famotidine 40mg  twice a day.  Follow up with GI.     Assessment and Plan: Brooke Navarro is a 47 y.o. female with: Cough variant asthma Past history - Patient noted coughing with posttussive emesis at times for the last few years. History of pleurisy. Finished treatment for latent TB.  Normal chest x-ray in 2021.  2022 spirometry was normal with 4% improvement in FEV1 post bronchodilator treatment.  Clinically feeling improved. Airduo worsened cough.  Interim history -   Seasonal allergic rhinitis  due to pollen Allergic rhinitis due to animal dander Allergic rhinitis due to dust mite Allergic conjunctivitis of both eyes Past history - Perennial rhinoconjunctivitis symptoms with  worsening the fall and winter - coughing is most bothersome.  2022 skin testing positive to grass, ragweed, weed, trees, dust mites, cat, dog, mouse. Borderline to mold. Started AIT on 02/16/2021 (G-W-RW-T, DM-C-D). Interim history -   Gastroesophageal reflux disease ***  Assessment and Plan              No follow-ups on file.  No orders of the defined types were placed in this encounter.  Lab Orders  No laboratory test(s) ordered today    Diagnostics: Spirometry:  Tracings reviewed. Her effort: {Blank single:19197::"Good reproducible efforts.","It was hard to get consistent efforts and there is a question as to whether this reflects a maximal maneuver.","Poor effort, data can not be interpreted."} FVC: ***L FEV1: ***L, ***% predicted FEV1/FVC ratio: ***% Interpretation: {Blank single:19197::"Spirometry consistent with mild obstructive disease","Spirometry consistent with moderate obstructive disease","Spirometry consistent with severe obstructive disease","Spirometry consistent with possible restrictive disease","Spirometry consistent with mixed obstructive and restrictive disease","Spirometry uninterpretable due to technique","Spirometry consistent with normal pattern","No overt abnormalities noted given today's efforts"}.  Please see scanned spirometry results for details.  Skin Testing: {Blank single:19197::"Select foods","Environmental allergy panel","Environmental allergy panel and select foods","Food allergy panel","None","Deferred due to recent antihistamines use"}. *** Results discussed with patient/family.   Medication List:  Current Outpatient Medications  Medication Sig Dispense Refill  . albuterol (VENTOLIN HFA) 108 (90 Base) MCG/ACT inhaler Inhale 2 puffs into the lungs every 4 hours as needed for wheezing, shortness of breath, or coughing fits. 8.5 g 0  . budesonide-formoterol (SYMBICORT) 80-4.5 MCG/ACT inhaler Inhale 2 puffs into the lungs in the morning and at  bedtime. with spacer and rinse mouth afterwards. 1 each 3  . clindamycin (CLINDAGEL) 1 % gel Apply 1 application  topically 2 (two) times daily.    . clobetasol ointment (TEMOVATE) 0.05 % Apply topically 2 (two) times daily.    . cyclobenzaprine (FLEXERIL) 10 MG tablet     . diphenhydrAMINE (BENADRYL) 25 MG tablet     . EPINEPHrine 0.3 mg/0.3 mL IJ SOAJ injection Inject 0.3 mg into the muscle.    . escitalopram (LEXAPRO) 10 MG tablet Take 10 mg by mouth daily.    . famotidine (PEPCID) 40 MG tablet Take 40 mg by mouth daily.    . fexofenadine (ALLEGRA ALLERGY) 180 MG tablet Take 1 tablet (180 mg total) by mouth daily for 15 days. 15 tablet 0  . JUNEL FE 1/20 1-20 MG-MCG tablet Take by mouth.    . levocetirizine (XYZAL) 5 MG tablet     . Misc Natural Products (COSAMIN ASU FOR JOINT HEALTH) CAPS     . montelukast (SINGULAIR) 10 MG tablet Take 1 tablet (10 mg total) by mouth at bedtime. 90 tablet 3  . Multiple Vitamin (MULTI-VITAMIN) tablet Take 1 tablet by mouth daily.    Marrion Coy (RYALTRIS) [id number] MCG/ACT SUSP Place 1-2 sprays into the nose in the morning and at bedtime. 29 g 5  . pantoprazole (PROTONIX) 40 MG tablet Take 40 mg by mouth daily.     No current facility-administered medications for this visit.   Allergies: No Known Allergies I reviewed her past medical history, social history, family history, and environmental history and no significant changes have been reported from her previous visit.  Review of Systems  Constitutional:  Negative for appetite change, chills, fever and unexpected weight change.  HENT:  Positive for congestion and rhinorrhea.   Eyes:  Negative for itching.  Respiratory:  Negative for cough, chest tightness, shortness of breath and wheezing.   Cardiovascular:  Negative for chest pain.  Gastrointestinal:  Negative for abdominal pain.  Genitourinary:  Negative for difficulty urinating.  Skin:  Negative for rash.  Allergic/Immunologic:  Positive for environmental allergies.  Neurological:  Negative for headaches.   Objective: There were no vitals taken for this visit. There is no height or weight on file to calculate BMI. Physical Exam Vitals and nursing note reviewed.  Constitutional:      Appearance: Normal appearance. She is well-developed.  HENT:     Head: Normocephalic and atraumatic.     Right Ear: Tympanic membrane and external ear normal.     Left Ear: Tympanic membrane and external ear normal.     Nose: Nose normal.     Mouth/Throat:     Mouth: Mucous membranes are moist.     Pharynx: Oropharynx is clear.  Eyes:     Conjunctiva/sclera: Conjunctivae normal.  Cardiovascular:     Rate and Rhythm: Normal rate and regular rhythm.     Heart sounds: Normal heart sounds. No murmur heard.    No friction rub. No gallop.  Pulmonary:     Effort: Pulmonary effort is normal.     Breath sounds: Normal breath sounds. No wheezing, rhonchi or rales.  Abdominal:     Palpations: Abdomen is soft.  Musculoskeletal:     Cervical back: Neck supple.  Skin:    General: Skin is warm.     Findings: No rash.  Neurological:     Mental Status: She is alert and oriented to person, place, and time.  Psychiatric:        Behavior: Behavior normal.  Previous notes and tests were reviewed. The plan was reviewed with the patient/family, and all questions/concerned were addressed.  It was my pleasure to see Brooke Navarro today and participate in her care. Please feel free to contact me with any questions or concerns.  Sincerely,  Wyline Mood, DO Allergy & Immunology  [facility] office: [phone] [facility] office: [phone]

## 2023-03-06 ENCOUNTER — Other Ambulatory Visit: Payer: Self-pay

## 2023-03-06 ENCOUNTER — Encounter: Payer: Self-pay | Admitting: Allergy

## 2023-03-06 ENCOUNTER — Telehealth: Payer: Self-pay | Admitting: *Deleted

## 2023-03-06 ENCOUNTER — Ambulatory Visit: Payer: PRIVATE HEALTH INSURANCE | Admitting: Allergy

## 2023-03-06 VITALS — BP 150/94 | HR 80 | Temp 98.2°F | Ht 69.0 in | Wt 226.2 lb

## 2023-03-06 DIAGNOSIS — J3081 Allergic rhinitis due to animal (cat) (dog) hair and dander: Secondary | ICD-10-CM

## 2023-03-06 DIAGNOSIS — J45991 Cough variant asthma: Secondary | ICD-10-CM

## 2023-03-06 DIAGNOSIS — J3089 Other allergic rhinitis: Secondary | ICD-10-CM

## 2023-03-06 DIAGNOSIS — R030 Elevated blood-pressure reading, without diagnosis of hypertension: Secondary | ICD-10-CM

## 2023-03-06 DIAGNOSIS — H1013 Acute atopic conjunctivitis, bilateral: Secondary | ICD-10-CM

## 2023-03-06 DIAGNOSIS — J301 Allergic rhinitis due to pollen: Secondary | ICD-10-CM

## 2023-03-06 DIAGNOSIS — K219 Gastro-esophageal reflux disease without esophagitis: Secondary | ICD-10-CM

## 2023-03-06 MED ORDER — EPINEPHRINE 0.3 MG/0.3ML IJ SOAJ: 0.3000 mg | INTRAMUSCULAR | 1 refills | Status: AC | PRN

## 2023-03-06 MED ORDER — AIRSUPRA 90-80 MCG/ACT IN AERO
2.0000 | INHALATION_SPRAY | RESPIRATORY_TRACT | 2 refills | Status: AC | PRN
Start: 2023-03-06 — End: ?

## 2023-03-06 NOTE — Telephone Encounter (Signed)
-----   Message from Ellamae Sia sent at 03/06/2023  1:23 PM EDT ----- Please move injections to red 0.5cc every 4 weeks. Thank you.

## 2023-03-06 NOTE — Patient Instructions (Addendum)
Environmental allergies 2022 skin testing positive to grass, ragweed, weed, trees, dust mites, cat, dog, mouse. Borderline to mold.  Continue environmental control measures. Continue allergy injections every 4 weeks. Use over the counter antihistamines such as Zyrtec (cetirizine), Claritin (loratadine), Allegra (fexofenadine), or Xyzal (levocetirizine) daily as needed. May take twice a day during allergy flares. May switch antihistamines every few months. Continue Singulair (montelukast) 10mg  daily at night. May use Ryaltris (olopatadine + mometasone nasal spray combination) 1-2 sprays per nostril twice a day. Nasal saline spray (i.e., Simply Saline) or nasal saline lavage (i.e., NeilMed) is recommended as needed and prior to medicated nasal sprays.  Breathing:  May use Airsupra rescue inhaler 2 puffs every 4 to 6 hours as needed for shortness of breath, chest tightness, coughing, and wheezing. Do not use more than 12 puffs in 24 hours. May use Airsupra rescue inhaler 2 puffs 5 to 15 minutes prior to strenuous physical activities. Rinse mouth after each use.  Coupon given.  Monitor frequency of use - if you need to use it more than twice per week on a consistent basis let us know.  If this is not covered let us know.  Breathing control goals:  Full participation in all desired activities (may need albuterol before activity) Albuterol use two times or less a week on average (not counting use with activity) Cough interfering with sleep two times or less a month Oral steroids no more than once a year No hospitalizations   Heartburn: Continue lifestyle and dietary modifications. Continue Protonix 40mg  twice a day. Continue famotidine 40mg  twice a day.  Follow up with GI.   Elevated blood pressure  Blood pressure reading was high in our office today. Vitals:   03/06/23 1051  BP: (!) 150/94   Please follow up with PCP regarding this.    Follow up in 6 months or sooner if needed.

## 2023-03-06 NOTE — Telephone Encounter (Signed)
Patient's allergy flow sheet has been updated to reflect these changes.

## 2023-03-16 ENCOUNTER — Ambulatory Visit (INDEPENDENT_AMBULATORY_CARE_PROVIDER_SITE_OTHER): Payer: PRIVATE HEALTH INSURANCE

## 2023-03-16 DIAGNOSIS — J309 Allergic rhinitis, unspecified: Secondary | ICD-10-CM

## 2023-03-24 ENCOUNTER — Ambulatory Visit (INDEPENDENT_AMBULATORY_CARE_PROVIDER_SITE_OTHER): Payer: Self-pay

## 2023-03-24 DIAGNOSIS — J309 Allergic rhinitis, unspecified: Secondary | ICD-10-CM

## 2023-04-03 ENCOUNTER — Ambulatory Visit (INDEPENDENT_AMBULATORY_CARE_PROVIDER_SITE_OTHER): Payer: PRIVATE HEALTH INSURANCE

## 2023-04-03 DIAGNOSIS — J309 Allergic rhinitis, unspecified: Secondary | ICD-10-CM

## 2023-05-01 DIAGNOSIS — R059 Cough, unspecified: Secondary | ICD-10-CM | POA: Insufficient documentation

## 2023-05-02 ENCOUNTER — Ambulatory Visit (INDEPENDENT_AMBULATORY_CARE_PROVIDER_SITE_OTHER): Payer: PRIVATE HEALTH INSURANCE

## 2023-05-02 DIAGNOSIS — J309 Allergic rhinitis, unspecified: Secondary | ICD-10-CM

## 2023-05-29 ENCOUNTER — Ambulatory Visit (INDEPENDENT_AMBULATORY_CARE_PROVIDER_SITE_OTHER): Payer: PRIVATE HEALTH INSURANCE

## 2023-05-29 DIAGNOSIS — J309 Allergic rhinitis, unspecified: Secondary | ICD-10-CM

## 2023-06-27 ENCOUNTER — Ambulatory Visit (INDEPENDENT_AMBULATORY_CARE_PROVIDER_SITE_OTHER): Payer: PRIVATE HEALTH INSURANCE | Admitting: *Deleted

## 2023-06-27 DIAGNOSIS — J309 Allergic rhinitis, unspecified: Secondary | ICD-10-CM

## 2023-07-25 ENCOUNTER — Ambulatory Visit (INDEPENDENT_AMBULATORY_CARE_PROVIDER_SITE_OTHER): Payer: Self-pay | Admitting: *Deleted

## 2023-07-25 DIAGNOSIS — J309 Allergic rhinitis, unspecified: Secondary | ICD-10-CM

## 2023-08-07 ENCOUNTER — Other Ambulatory Visit: Payer: Self-pay

## 2023-08-07 MED ORDER — RYALTRIS 665-25 MCG/ACT NA SUSP: 1.0000 | Freq: Two times a day (BID) | NASAL | 5 refills | Status: DC

## 2023-08-30 ENCOUNTER — Ambulatory Visit (INDEPENDENT_AMBULATORY_CARE_PROVIDER_SITE_OTHER): Payer: Self-pay | Admitting: *Deleted

## 2023-08-30 DIAGNOSIS — J309 Allergic rhinitis, unspecified: Secondary | ICD-10-CM

## 2023-09-03 NOTE — Progress Notes (Unsigned)
 Follow Up Note  RE: Brooke Navarro MRN: [id number] DOB: 1975/07/23 Date of Office Visit: 09/04/2023  Referring provider: Tally Joe, MD Primary care provider: Tally Joe, MD  Chief Complaint: No chief complaint on file.  History of Present Illness: I had the pleasure of seeing Brooke Navarro for a follow up visit at the [facility] on 09/03/2023. She is a 48 y.o. female, who is being followed for allergic rhinoconjunctivitis on AIT, cough variant asthma and GERD. Her previous allergy office visit was on 03/06/2023 with Dr. Selena Batten. Today is a regular follow up visit.  Discussed the use of AI scribe software for clinical note transcription with the patient, who gave verbal consent to proceed.  History of Present Illness            ***  Assessment and Plan: Brooke Navarro is a 48 y.o. female with: Seasonal allergic rhinitis due to pollen Allergic rhinitis due to animal dander Allergic rhinitis due to dust mite Allergic conjunctivitis of both eyes Past history - Perennial rhinoconjunctivitis symptoms with worsening the fall and winter - coughing is most bothersome.  2022 skin testing positive to grass, ragweed, weed, trees, dust mites, cat, dog, mouse. Borderline to mold. Started AIT on 02/16/2021 (G-W-RW-T, DM-C-D). Interim history - symptomatic if misses a dose of Singulair/Xyzal. No issues with AIT.  Continue environmental control measures. Continue allergy injections every 4 weeks. Use over the counter antihistamines such as Zyrtec (cetirizine), Claritin (loratadine), Allegra (fexofenadine), or Xyzal (levocetirizine) daily as needed. May take twice a day during allergy flares. May switch antihistamines every few months. Continue Singulair (montelukast) 10mg  daily at night. May use Ryaltris (olopatadine + mometasone nasal spray combination) 1-2 sprays per nostril twice a day. Nasal saline spray (i.e., Simply Saline) or nasal saline lavage (i.e., NeilMed) is  recommended as needed and prior to medicated nasal sprays.   Cough variant asthma Past history - Patient noted coughing with posttussive emesis at times for the last few years. History of pleurisy. Finished treatment for latent TB.  Normal chest x-ray in 2021.  2022 spirometry was normal with 4% improvement in FEV1 post bronchodilator treatment.  Clinically feeling improved. Airduo worsened cough.  Interim history - no worsening with stopping Symbicort. Coughing is most likely from reflux.  Today's spirometry was normal.  May use Airsupra rescue inhaler 2 puffs every 4 to 6 hours as needed for shortness of breath, chest tightness, coughing, and wheezing. Do not use more than 12 puffs in 24 hours. May use Airsupra rescue inhaler 2 puffs 5 to 15 minutes prior to strenuous physical activities. Rinse mouth after each use.  Coupon given.  Monitor frequency of use - if you need to use it more than twice per week on a consistent basis let us know.  If this is not covered let us know.    Gastroesophageal reflux disease Continue lifestyle and dietary modifications. Continue Protonix 40mg  twice a day. Continue famotidine 40mg  twice a day.  Follow up with GI.  Assessment and Plan              No follow-ups on file.  No orders of the defined types were placed in this encounter.  Lab Orders  No laboratory test(s) ordered today    Diagnostics: Spirometry:  Tracings reviewed. Her effort: {Blank single:19197::"Good reproducible efforts.","It was hard to get consistent efforts and there is a question as to whether this reflects a maximal maneuver.","Poor effort, data can not be interpreted."} FVC: ***L FEV1: ***L, ***%  predicted FEV1/FVC ratio: ***% Interpretation: {Blank single:19197::"Spirometry consistent with mild obstructive disease","Spirometry consistent with moderate obstructive disease","Spirometry consistent with severe obstructive disease","Spirometry consistent with possible  restrictive disease","Spirometry consistent with mixed obstructive and restrictive disease","Spirometry uninterpretable due to technique","Spirometry consistent with normal pattern","No overt abnormalities noted given today's efforts"}.  Please see scanned spirometry results for details.  Skin Testing: {Blank single:19197::"Select foods","Environmental allergy panel","Environmental allergy panel and select foods","Food allergy panel","None","Deferred due to recent antihistamines use"}. *** Results discussed with patient/family.   Medication List:  Current Outpatient Medications  Medication Sig Dispense Refill  . albuterol (VENTOLIN HFA) 108 (90 Base) MCG/ACT inhaler Inhale 2 puffs into the lungs every 4 hours as needed for wheezing, shortness of breath, or coughing fits. 8.5 g 0  . Albuterol-Budesonide (AIRSUPRA) 90-80 MCG/ACT AERO Inhale 2 puffs into the lungs every 4 (four) hours as needed (coughing, wheezing, chest tightness). Do not exceed 12 puffs in 24 hours. 10.7 g 2  . clindamycin (CLINDAGEL) 1 % gel Apply 1 application  topically 2 (two) times daily.    . clobetasol ointment (TEMOVATE) 0.05 % Apply topically 2 (two) times daily.    . cyclobenzaprine (FLEXERIL) 10 MG tablet     . diphenhydrAMINE (BENADRYL) 25 MG tablet     . EPINEPHrine 0.3 mg/0.3 mL IJ SOAJ injection Inject 0.3 mg into the muscle as needed for anaphylaxis. 2 each 1  . escitalopram (LEXAPRO) 10 MG tablet Take 10 mg by mouth daily.    . famotidine (PEPCID) 40 MG tablet Take 40 mg by mouth daily.    Colleen Can FE 1/20 1-20 MG-MCG tablet Take by mouth.    . levocetirizine (XYZAL) 5 MG tablet     . Misc Natural Products (COSAMIN ASU FOR JOINT HEALTH) CAPS     . montelukast (SINGULAIR) 10 MG tablet Take 1 tablet (10 mg total) by mouth at bedtime. 90 tablet 3  . Multiple Vitamin (MULTI-VITAMIN) tablet Take 1 tablet by mouth daily.    Marrion Coy (RYALTRIS) [id number] MCG/ACT SUSP Place 1-2 sprays into the nose in the  morning and at bedtime. 29 g 5  . pantoprazole (PROTONIX) 40 MG tablet Take 40 mg by mouth daily.     No current facility-administered medications for this visit.   Allergies: No Known Allergies I reviewed her past medical history, social history, family history, and environmental history and no significant changes have been reported from her previous visit.  Review of Systems  Constitutional:  Negative for appetite change, chills, fever and unexpected weight change.  HENT:  Negative for congestion and rhinorrhea.   Eyes:  Negative for itching.  Respiratory:  Positive for cough. Negative for chest tightness, shortness of breath and wheezing.   Cardiovascular:  Negative for chest pain.  Gastrointestinal:  Negative for abdominal pain.  Genitourinary:  Negative for difficulty urinating.  Skin:  Negative for rash.  Allergic/Immunologic: Positive for environmental allergies.  Neurological:  Negative for headaches.   Objective: There were no vitals taken for this visit. There is no height or weight on file to calculate BMI. Physical Exam Vitals and nursing note reviewed.  Constitutional:      Appearance: Normal appearance. She is well-developed.  HENT:     Head: Normocephalic and atraumatic.     Right Ear: Tympanic membrane and external ear normal.     Left Ear: Tympanic membrane and external ear normal.     Nose: Nose normal.     Mouth/Throat:     Mouth: Mucous membranes are moist.  Pharynx: Oropharynx is clear.  Eyes:     Conjunctiva/sclera: Conjunctivae normal.  Cardiovascular:     Rate and Rhythm: Normal rate and regular rhythm.     Heart sounds: Normal heart sounds. No murmur heard.    No friction rub. No gallop.  Pulmonary:     Effort: Pulmonary effort is normal.     Breath sounds: Normal breath sounds. No wheezing, rhonchi or rales.  Abdominal:     Palpations: Abdomen is soft.  Musculoskeletal:     Cervical back: Neck supple.  Skin:    General: Skin is warm.      Findings: No rash.  Neurological:     Mental Status: She is alert and oriented to person, place, and time.  Psychiatric:        Behavior: Behavior normal.  Previous notes and tests were reviewed. The plan was reviewed with the patient/family, and all questions/concerned were addressed.  It was my pleasure to see Brooke Navarro today and participate in her care. Please feel free to contact me with any questions or concerns.  Sincerely,  Wyline Mood, DO Allergy & Immunology  [facility] office: [phone] [facility] office: [phone]

## 2023-09-04 ENCOUNTER — Encounter: Payer: Self-pay | Admitting: Allergy

## 2023-09-04 ENCOUNTER — Other Ambulatory Visit: Payer: Self-pay

## 2023-09-04 ENCOUNTER — Ambulatory Visit: Payer: PRIVATE HEALTH INSURANCE | Admitting: Allergy

## 2023-09-04 VITALS — BP 134/86 | HR 91 | Temp 98.0°F | Resp 18 | Ht 69.0 in | Wt 224.5 lb

## 2023-09-04 DIAGNOSIS — J3081 Allergic rhinitis due to animal (cat) (dog) hair and dander: Secondary | ICD-10-CM

## 2023-09-04 DIAGNOSIS — J45991 Cough variant asthma: Secondary | ICD-10-CM

## 2023-09-04 DIAGNOSIS — K219 Gastro-esophageal reflux disease without esophagitis: Secondary | ICD-10-CM

## 2023-09-04 DIAGNOSIS — J301 Allergic rhinitis due to pollen: Secondary | ICD-10-CM

## 2023-09-04 DIAGNOSIS — H1013 Acute atopic conjunctivitis, bilateral: Secondary | ICD-10-CM

## 2023-09-04 DIAGNOSIS — J3089 Other allergic rhinitis: Secondary | ICD-10-CM

## 2023-09-04 MED ORDER — MONTELUKAST SODIUM 10 MG PO TABS: 10.0000 mg | ORAL_TABLET | Freq: Every day | ORAL | 3 refills | Status: AC

## 2023-09-04 MED ORDER — RYALTRIS 665-25 MCG/ACT NA SUSP: 1.0000 | Freq: Two times a day (BID) | NASAL | 3 refills | Status: DC

## 2023-09-04 NOTE — Patient Instructions (Addendum)
 Environmental allergies 2022 skin testing positive to grass, ragweed, weed, trees, dust mites, cat, dog, mouse. Borderline to mold.  Continue environmental control measures. Continue allergy injections every 4 weeks. Use over the counter antihistamines such as Zyrtec (cetirizine), Claritin (loratadine), Allegra (fexofenadine), or Xyzal (levocetirizine) daily as needed. May take twice a day during allergy flares. May switch antihistamines every few months. Continue Singulair (montelukast) 10mg  daily at night. May use Ryaltris (olopatadine + mometasone nasal spray combination) 1-2 sprays per nostril twice a day. Sent in 3 months supply to [facility].  Nasal saline spray (i.e., Simply Saline) or nasal saline lavage (i.e., NeilMed) is recommended as needed and prior to medicated nasal sprays.  Breathing:  Breathing test slightly worse than last one most likely due to recent Covid infection but still within the normal ranges. No additional medications needed at this time. May use Airsupra rescue inhaler 2 puffs every 4 to 6 hours as needed for shortness of breath, chest tightness, coughing, and wheezing. Do not use more than 12 puffs in 24 hours. May use Airsupra rescue inhaler 2 puffs 5 to 15 minutes prior to strenuous physical activities. Rinse mouth after each use.  Monitor frequency of use - if you need to use it more than twice per week on a consistent basis let us know.  Breathing control goals:  Full participation in all desired activities (may need albuterol before activity) Albuterol use two times or less a week on average (not counting use with activity) Cough interfering with sleep two times or less a month Oral steroids no more than once a year No hospitalizations   Heartburn Continue lifestyle and dietary modifications. Continue Protonix 40mg  twice a day. Continue famotidine 40mg  twice a day.  Follow up with GI.   Follow up in 6 months or sooner if needed.

## 2023-09-28 ENCOUNTER — Ambulatory Visit (INDEPENDENT_AMBULATORY_CARE_PROVIDER_SITE_OTHER): Payer: PRIVATE HEALTH INSURANCE

## 2023-09-28 DIAGNOSIS — J309 Allergic rhinitis, unspecified: Secondary | ICD-10-CM | POA: Diagnosis not present

## 2023-10-10 DIAGNOSIS — J301 Allergic rhinitis due to pollen: Secondary | ICD-10-CM | POA: Diagnosis not present

## 2023-10-10 NOTE — Progress Notes (Signed)
VIALS MADE 10/10/23

## 2023-10-11 DIAGNOSIS — J3081 Allergic rhinitis due to animal (cat) (dog) hair and dander: Secondary | ICD-10-CM | POA: Diagnosis not present

## 2023-10-23 ENCOUNTER — Ambulatory Visit (INDEPENDENT_AMBULATORY_CARE_PROVIDER_SITE_OTHER): Payer: PRIVATE HEALTH INSURANCE

## 2023-10-23 DIAGNOSIS — J309 Allergic rhinitis, unspecified: Secondary | ICD-10-CM

## 2023-11-22 ENCOUNTER — Ambulatory Visit (INDEPENDENT_AMBULATORY_CARE_PROVIDER_SITE_OTHER): Payer: PRIVATE HEALTH INSURANCE

## 2023-11-22 DIAGNOSIS — J309 Allergic rhinitis, unspecified: Secondary | ICD-10-CM | POA: Diagnosis not present

## 2023-11-27 ENCOUNTER — Ambulatory Visit (INDEPENDENT_AMBULATORY_CARE_PROVIDER_SITE_OTHER): Payer: PRIVATE HEALTH INSURANCE

## 2023-11-27 DIAGNOSIS — J309 Allergic rhinitis, unspecified: Secondary | ICD-10-CM

## 2023-12-05 ENCOUNTER — Ambulatory Visit (INDEPENDENT_AMBULATORY_CARE_PROVIDER_SITE_OTHER): Payer: PRIVATE HEALTH INSURANCE

## 2023-12-05 DIAGNOSIS — J309 Allergic rhinitis, unspecified: Secondary | ICD-10-CM

## 2024-01-08 ENCOUNTER — Ambulatory Visit (INDEPENDENT_AMBULATORY_CARE_PROVIDER_SITE_OTHER): Payer: PRIVATE HEALTH INSURANCE

## 2024-01-08 DIAGNOSIS — J309 Allergic rhinitis, unspecified: Secondary | ICD-10-CM

## 2024-02-08 ENCOUNTER — Ambulatory Visit (INDEPENDENT_AMBULATORY_CARE_PROVIDER_SITE_OTHER): Payer: PRIVATE HEALTH INSURANCE

## 2024-02-08 DIAGNOSIS — J309 Allergic rhinitis, unspecified: Secondary | ICD-10-CM

## 2024-03-03 NOTE — Progress Notes (Unsigned)
 Follow Up Note  RE: Brooke Navarro MRN: [id number] DOB: 1976/01/27 Date of Office Visit: 03/04/2024  Referring provider: Seabron Lenis, MD Primary care provider: Seabron Lenis, MD  Chief Complaint: No chief complaint on file.  History of Present Illness: I had the pleasure of seeing Brooke Navarro for a follow up visit at the [facility] on 03/04/2024. She is a 48 y.o. female, who is being followed for allergic rhinoconjunctivitis on AIT, cough variant asthma and GERD. Her previous allergy  office visit was on 09/04/2023 with Dr. Luke. Today is a regular follow up visit.  Discussed the use of AI scribe software for clinical note transcription with the patient, who gave verbal consent to proceed.  History of Present Illness            ***  Assessment and Plan: Brooke Navarro is a 48 y.o. female with: Seasonal allergic rhinitis due to pollen Allergic rhinitis due to animal dander Allergic rhinitis due to dust mite Allergic conjunctivitis of both eyes Past history - Perennial rhinoconjunctivitis symptoms with worsening the fall and winter - coughing is most bothersome.  2022 skin testing positive to grass, ragweed, weed, trees, dust mites, cat, dog, mouse. Borderline to mold. Started AIT on 02/16/2021 (G-[id number], DM-C-D). Interim history - improved with AIT.  Continue environmental control measures. Continue allergy  injections every 4 weeks. Use over the counter antihistamines such as Zyrtec (cetirizine), Claritin (loratadine), Allegra  (fexofenadine ), or Xyzal (levocetirizine) daily as needed. May take twice a day during allergy  flares. May switch antihistamines every few months. Continue Singulair  (montelukast ) 10mg  daily at night. May use Ryaltris  (olopatadine + mometasone nasal spray combination) 1-2 sprays per nostril twice a day. Sent in 3 months supply to [facility].  Nasal saline spray (i.e., Simply Saline) or nasal saline lavage (i.e., NeilMed) is  recommended as needed and prior to medicated nasal sprays.   Cough variant asthma Past history - Patient noted coughing with posttussive emesis at times for the last few years. History of pleurisy. Finished treatment for latent TB.  Normal chest x-ray in 2021.  2022 spirometry was normal with 4% improvement in FEV1 post bronchodilator treatment.  Clinically feeling improved. Airduo worsened cough.  Interim history - well-controlled with infrequent inhaler use, approximately once a month. Recent COVID-19 infection did not significantly exacerbate asthma symptoms, though there was increased throat sensitivity and cough.  Today's spirometry was normal.  May use Airsupra  rescue inhaler 2 puffs every 4 to 6 hours as needed for shortness of breath, chest tightness, coughing, and wheezing. Do not use more than 12 puffs in 24 hours. May use Airsupra  rescue inhaler 2 puffs 5 to 15 minutes prior to strenuous physical activities. Rinse mouth after each use.  Monitor frequency of use - if you need to use it more than twice per week on a consistent basis let us  know.    Gastroesophageal reflux disease Continue lifestyle and dietary modifications. Continue Protonix 40mg  twice a day. Continue famotidine 40mg  twice a day.  Follow up with GI in June as scheduled.  Assessment and Plan              No follow-ups on file.  No orders of the defined types were placed in this encounter.  Lab Orders  No laboratory test(s) ordered today    Diagnostics: Spirometry:  Tracings reviewed. Her effort: {Blank single:19197::Good reproducible efforts.,It was hard to get consistent efforts and there is a question as to whether this reflects a maximal maneuver.,Poor effort, data can  not be interpreted.} FVC: ***L FEV1: ***L, ***% predicted FEV1/FVC ratio: ***% Interpretation: {Blank single:19197::Spirometry consistent with mild obstructive disease,Spirometry consistent with moderate obstructive  disease,Spirometry consistent with severe obstructive disease,Spirometry consistent with possible restrictive disease,Spirometry consistent with mixed obstructive and restrictive disease,Spirometry uninterpretable due to technique,Spirometry consistent with normal pattern,No overt abnormalities noted given today's efforts}.  Please see scanned spirometry results for details.  Skin Testing: {Blank single:19197::Select foods,Environmental allergy  panel,Environmental allergy  panel and select foods,Food allergy  panel,None,Deferred due to recent antihistamines use}. *** Results discussed with patient/family.   Medication List:  Current Outpatient Medications  Medication Sig Dispense Refill   Albuterol -Budesonide  (AIRSUPRA ) 90-80 MCG/ACT AERO Inhale 2 puffs into the lungs every 4 (four) hours as needed (coughing, wheezing, chest tightness). Do not exceed 12 puffs in 24 hours. 10.7 g 2   clindamycin (CLINDAGEL) 1 % gel Apply 1 application  topically 2 (two) times daily.     clobetasol ointment (TEMOVATE) 0.05 % Apply topically 2 (two) times daily.     cyclobenzaprine (FLEXERIL) 10 MG tablet      diphenhydrAMINE (BENADRYL) 25 MG tablet      EPINEPHrine  0.3 mg/0.3 mL IJ SOAJ injection Inject 0.3 mg into the muscle as needed for anaphylaxis. 2 each 1   escitalopram (LEXAPRO) 10 MG tablet Take 10 mg by mouth daily.     famotidine (PEPCID) 40 MG tablet Take 40 mg by mouth daily.     JUNEL FE 1/20 1-20 MG-MCG tablet Take by mouth.     levocetirizine (XYZAL) 5 MG tablet      Misc Natural Products (COSAMIN ASU FOR JOINT HEALTH) CAPS      montelukast  (SINGULAIR ) 10 MG tablet Take 1 tablet (10 mg total) by mouth at bedtime. 90 tablet 3   Multiple Vitamin (MULTI-VITAMIN) tablet Take 1 tablet by mouth daily.     Olopatadine-Mometasone (RYALTRIS ) 665-25 MCG/ACT SUSP Place 1-2 sprays into the nose in the morning and at bedtime. 87 g 3   pantoprazole (PROTONIX) 40 MG tablet Take 40 mg by  mouth daily.     No current facility-administered medications for this visit.   Allergies: Allergies  Allergen Reactions   Pollen Extract Other (See Comments)   I reviewed her past medical history, social history, family history, and environmental history and no significant changes have been reported from her previous visit.  Review of Systems  Constitutional:  Negative for appetite change, chills, fever and unexpected weight change.  HENT:  Negative for congestion and rhinorrhea.   Eyes:  Negative for itching.  Respiratory:  Negative for cough, chest tightness, shortness of breath and wheezing.   Cardiovascular:  Negative for chest pain.  Gastrointestinal:  Negative for abdominal pain.  Genitourinary:  Negative for difficulty urinating.  Skin:  Negative for rash.  Allergic/Immunologic: Positive for environmental allergies.  Neurological:  Negative for headaches.    Objective: There were no vitals taken for this visit. There is no height or weight on file to calculate BMI. Physical Exam Vitals and nursing note reviewed.  Constitutional:      Appearance: Normal appearance. She is well-developed.  HENT:     Head: Normocephalic and atraumatic.     Right Ear: Tympanic membrane and external ear normal.     Left Ear: Tympanic membrane and external ear normal.     Nose: Nose normal.     Mouth/Throat:     Mouth: Mucous membranes are moist.     Pharynx: Oropharynx is clear.  Eyes:     Conjunctiva/sclera: Conjunctivae normal.  Cardiovascular:     Rate and Rhythm: Normal rate and regular rhythm.     Heart sounds: Normal heart sounds. No murmur heard.    No friction rub. No gallop.  Pulmonary:     Effort: Pulmonary effort is normal.     Breath sounds: Normal breath sounds. No wheezing, rhonchi or rales.  Abdominal:     Palpations: Abdomen is soft.  Musculoskeletal:     Cervical back: Neck supple.  Skin:    General: Skin is warm.     Findings: No rash.  Neurological:      Mental Status: She is alert and oriented to person, place, and time.  Psychiatric:        Behavior: Behavior normal.    Previous notes and tests were reviewed. The plan was reviewed with the patient/family, and all questions/concerned were addressed.  It was my pleasure to see Brooke Navarro today and participate in her care. Please feel free to contact me with any questions or concerns.  Sincerely,  Orlan Cramp, DO Allergy  & Immunology  [facility]  [place] office: [phone] [facility] office: [phone]

## 2024-03-04 ENCOUNTER — Encounter: Payer: Self-pay | Admitting: Allergy

## 2024-03-04 ENCOUNTER — Other Ambulatory Visit: Payer: Self-pay

## 2024-03-04 ENCOUNTER — Ambulatory Visit (INDEPENDENT_AMBULATORY_CARE_PROVIDER_SITE_OTHER): Payer: PRIVATE HEALTH INSURANCE | Admitting: Allergy

## 2024-03-04 VITALS — BP 128/80 | HR 73 | Temp 98.0°F | Resp 18 | Ht 69.0 in | Wt 228.9 lb

## 2024-03-04 DIAGNOSIS — J3081 Allergic rhinitis due to animal (cat) (dog) hair and dander: Secondary | ICD-10-CM

## 2024-03-04 DIAGNOSIS — J301 Allergic rhinitis due to pollen: Secondary | ICD-10-CM | POA: Diagnosis not present

## 2024-03-04 DIAGNOSIS — J45991 Cough variant asthma: Secondary | ICD-10-CM

## 2024-03-04 DIAGNOSIS — J3089 Other allergic rhinitis: Secondary | ICD-10-CM | POA: Diagnosis not present

## 2024-03-04 DIAGNOSIS — H1013 Acute atopic conjunctivitis, bilateral: Secondary | ICD-10-CM

## 2024-03-04 DIAGNOSIS — K219 Gastro-esophageal reflux disease without esophagitis: Secondary | ICD-10-CM

## 2024-03-04 MED ORDER — RYALTRIS 665-25 MCG/ACT NA SUSP: 1.0000 | Freq: Two times a day (BID) | NASAL | 3 refills | Status: AC

## 2024-03-04 NOTE — Patient Instructions (Addendum)
 Environmental allergies 2022 skin testing positive to grass, ragweed, weed, trees, dust mites, cat, dog, mouse. Borderline to mold.  Continue environmental control measures. Continue allergy  injections every 4 weeks. Use over the counter antihistamines such as Zyrtec (cetirizine), Claritin (loratadine), Allegra  (fexofenadine ), or Xyzal (levocetirizine) daily as needed. May take twice a day during allergy  flares. May switch antihistamines every few months. May try to stop Singulair  (montelukast ) 10mg . If you notice any increased allergy  symptoms or breathing issues then restart.  May use Ryaltris  (olopatadine + mometasone nasal spray combination) 1-2 sprays per nostril twice a day. Sent in 3 months supply to [facility].  Nasal saline spray (i.e., Simply Saline) or nasal saline lavage (i.e., NeilMed) is recommended as needed and prior to medicated nasal sprays.  Breathing:  Normal spirometry today. May use Airsupra  rescue inhaler 2 puffs every 4 to 6 hours as needed for shortness of breath, chest tightness, coughing, and wheezing. Do not use more than 12 puffs in 24 hours. May use Airsupra  rescue inhaler 2 puffs 5 to 15 minutes prior to strenuous physical activities. Rinse mouth after each use.  Monitor frequency of use - if you need to use it more than twice per week on a consistent basis let us  know.  Breathing control goals:  Full participation in all desired activities (may need albuterol  before activity) Albuterol  use two times or less a week on average (not counting use with activity) Cough interfering with sleep two times or less a month Oral steroids no more than once a year No hospitalizations   Heartburn Continue lifestyle and dietary modifications. Continue Voquezna as prescribed.  Follow up with GI.   Follow up in 6 months or sooner if needed.

## 2024-04-08 ENCOUNTER — Ambulatory Visit (INDEPENDENT_AMBULATORY_CARE_PROVIDER_SITE_OTHER): Payer: PRIVATE HEALTH INSURANCE

## 2024-04-08 DIAGNOSIS — J309 Allergic rhinitis, unspecified: Secondary | ICD-10-CM | POA: Diagnosis not present

## 2024-05-13 ENCOUNTER — Ambulatory Visit (INDEPENDENT_AMBULATORY_CARE_PROVIDER_SITE_OTHER): Payer: PRIVATE HEALTH INSURANCE

## 2024-05-13 DIAGNOSIS — J309 Allergic rhinitis, unspecified: Secondary | ICD-10-CM

## 2024-06-11 DIAGNOSIS — J301 Allergic rhinitis due to pollen: Secondary | ICD-10-CM | POA: Diagnosis not present

## 2024-06-11 DIAGNOSIS — J3089 Other allergic rhinitis: Secondary | ICD-10-CM | POA: Diagnosis not present

## 2024-06-11 DIAGNOSIS — J3081 Allergic rhinitis due to animal (cat) (dog) hair and dander: Secondary | ICD-10-CM | POA: Diagnosis not present

## 2024-06-11 NOTE — Progress Notes (Signed)
 VIALS MADE ON 06/11/24

## 2024-06-14 ENCOUNTER — Ambulatory Visit: Admitting: *Deleted

## 2024-06-14 DIAGNOSIS — J302 Other seasonal allergic rhinitis: Secondary | ICD-10-CM

## 2024-07-11 ENCOUNTER — Ambulatory Visit

## 2024-07-11 DIAGNOSIS — J302 Other seasonal allergic rhinitis: Secondary | ICD-10-CM

## 2024-08-26 ENCOUNTER — Ambulatory Visit: Payer: PRIVATE HEALTH INSURANCE | Admitting: Allergy
